# Patient Record
Sex: Male | Born: 2000 | Race: White | Hispanic: No | Marital: Single | Smoking: Never smoker
Health system: Southern US, Community
[De-identification: ages and names within clinical notes are randomized; demographics above are authoritative.]

## PROBLEM LIST (undated history)

## (undated) ENCOUNTER — Emergency Department (HOSPITAL_COMMUNITY): Admission: EM | Payer: Managed Care, Other (non HMO) | Source: Home / Self Care

## (undated) DIAGNOSIS — J45909 Unspecified asthma, uncomplicated: Secondary | ICD-10-CM

## (undated) HISTORY — PX: ELBOW SURGERY: SHX618

---

## 2021-11-19 ENCOUNTER — Encounter (HOSPITAL_COMMUNITY): Payer: Self-pay | Admitting: Emergency Medicine

## 2021-11-19 ENCOUNTER — Emergency Department (HOSPITAL_COMMUNITY)
Admission: EM | Admit: 2021-11-19 | Discharge: 2021-11-19 | Disposition: A | Discharge status: Home / Self Care | Payer: BC Managed Care – PPO | Attending: Emergency Medicine | Admitting: Emergency Medicine

## 2021-11-19 ENCOUNTER — Emergency Department (HOSPITAL_COMMUNITY): Payer: BC Managed Care – PPO

## 2021-11-19 ENCOUNTER — Other Ambulatory Visit: Payer: Self-pay

## 2021-11-19 DIAGNOSIS — Z20822 Contact with and (suspected) exposure to covid-19: Secondary | ICD-10-CM | POA: Insufficient documentation

## 2021-11-19 DIAGNOSIS — R0602 Shortness of breath: Secondary | ICD-10-CM | POA: Diagnosis present

## 2021-11-19 DIAGNOSIS — J4541 Moderate persistent asthma with (acute) exacerbation: Secondary | ICD-10-CM | POA: Diagnosis not present

## 2021-11-19 HISTORY — DX: Unspecified asthma, uncomplicated: J45.909

## 2021-11-19 LAB — RESP PANEL BY RT-PCR (FLU A&B, COVID) ARPGX2
Influenza A by PCR: NEGATIVE
Influenza B by PCR: NEGATIVE
SARS Coronavirus 2 by RT PCR: NEGATIVE

## 2021-11-19 MED ORDER — IPRATROPIUM-ALBUTEROL 0.5-2.5 (3) MG/3ML IN SOLN
3.0000 mL | Freq: Once | RESPIRATORY_TRACT | Status: AC
  Administered 2021-11-19: 3 mL via RESPIRATORY_TRACT
  Filled 2021-11-19: qty 3

## 2021-11-19 MED ORDER — METHYLPREDNISOLONE SODIUM SUCC 125 MG IJ SOLR
125.0000 mg | Freq: Once | INTRAMUSCULAR | Status: AC
  Administered 2021-11-19: 125 mg via INTRAVENOUS
  Filled 2021-11-19: qty 2

## 2021-11-19 MED ORDER — PREDNISONE 10 MG PO TABS: 40.0000 mg | ORAL_TABLET | Freq: Every day | ORAL | 0 refills | Status: DC

## 2021-11-19 MED ORDER — MAGNESIUM SULFATE 2 GM/50ML IV SOLN
2.0000 g | Freq: Once | INTRAVENOUS | Status: AC
  Administered 2021-11-19: 2 g via INTRAVENOUS
  Filled 2021-11-19: qty 50

## 2021-11-19 NOTE — ED Triage Notes (Signed)
Pt reports asthma problems, woke up SOB.  Pt reports smoking marijuana.

## 2021-11-19 NOTE — ED Provider Notes (Signed)
[facility] EMERGENCY DEPARTMENT Provider Note   CSN: [id number] Arrival date & time: 11/19/21  0539     History  Chief Complaint  Patient presents with   Shortness of Breath    Jeffery Anderson is a 21 y.o. male.   Shortness of Breath Associated symptoms: cough   Associated symptoms: no abdominal pain, no chest pain, no fever, no headaches, no rash and no vomiting    Patient is a 21 year old male with past medical history significant for asthma.  He states that he has an albuterol inhaler he states that he has previously been on Symbicort but is not currently.  He has not been following with pulmonologist or PCP due to insurance issues  He states that last night he became more short of breath he states he has had some cough and fatigue and shortness of breath over the past couple weeks which he attributed to weather changes states that his shortness of breath became significantly worse overnight.  He states that he uses albuterol inhaler without significant relief.     Home Medications Prior to Admission medications   Medication Sig Start Date End Date Taking? Authorizing Provider  predniSONE (DELTASONE) 10 MG tablet Take 4 tablets (40 mg total) by mouth daily. 11/19/21  Yes Tedd Sias, PA      Allergies    Patient has no known allergies.    Review of Systems   Review of Systems  Constitutional:  Positive for fatigue. Negative for chills and fever.  HENT:  Negative for congestion.   Eyes:  Negative for pain.  Respiratory:  Positive for cough and shortness of breath.   Cardiovascular:  Negative for chest pain and leg swelling.  Gastrointestinal:  Negative for abdominal pain and vomiting.  Genitourinary:  Negative for dysuria.  Musculoskeletal:  Negative for myalgias.  Skin:  Negative for rash.  Neurological:  Negative for dizziness and headaches.   Physical Exam Updated Vital Signs BP 127/76 (BP Location: Right Arm)    Pulse 84    Temp (!)  97.5 F (36.4 C) (Oral)    Resp 16    SpO2 97%  Physical Exam Vitals and nursing note reviewed.  Constitutional:      General: He is not in acute distress. HENT:     Head: Normocephalic and atraumatic.     Nose: Nose normal.  Eyes:     General: No scleral icterus. Cardiovascular:     Rate and Rhythm: Normal rate and regular rhythm.     Pulses: Normal pulses.     Heart sounds: Normal heart sounds.  Pulmonary:     Effort: Pulmonary effort is normal. No respiratory distress.     Breath sounds: No wheezing.     Comments: Speaking in full sentences, inspiratory and expiratory phase.  No wheezing auscultated. Abdominal:     Palpations: Abdomen is soft.     Tenderness: There is no abdominal tenderness.  Musculoskeletal:     Cervical back: Normal range of motion.     Right lower leg: No edema.     Left lower leg: No edema.  Skin:    General: Skin is warm and dry.     Capillary Refill: Capillary refill takes less than 2 seconds.  Neurological:     Mental Status: He is alert. Mental status is at baseline.  Psychiatric:        Mood and Affect: Mood normal.        Behavior: Behavior normal.  ED Results / Procedures / Treatments   Labs (all labs ordered are listed, but only abnormal results are displayed) Labs Reviewed  RESP PANEL BY RT-PCR (FLU A&B, COVID) ARPGX2    EKG None  Radiology DG Chest 2 View  Result Date: 11/19/2021 CLINICAL DATA:  Shortness of breath EXAM: CHEST - 2 VIEW COMPARISON:  None. FINDINGS: The heart size and mediastinal contours are within normal limits. Both lungs are clear. No pleural effusion or pneumothorax. The visualized skeletal structures are unremarkable. IMPRESSION: No acute process in the chest. Electronically Signed   By: Macy Mis M.D.   On: 11/19/2021 08:07    Procedures Procedures    Medications Ordered in ED Medications  ipratropium-albuterol (DUONEB) 0.5-2.5 (3) MG/3ML nebulizer solution 3 mL (3 mLs Nebulization Given 11/19/21  0604)  magnesium sulfate IVPB 2 g 50 mL (0 g Intravenous Stopped 11/19/21 0738)  methylPREDNISolone sodium succinate (SOLU-MEDROL) 125 mg/2 mL injection 125 mg (125 mg Intravenous Given 11/19/21 0606)  ipratropium-albuterol (DUONEB) 0.5-2.5 (3) MG/3ML nebulizer solution 3 mL (3 mLs Nebulization Given 11/19/21 [id number])    ED Course/ Medical Decision Making/ A&P                           Medical Decision Making Risk Prescription drug management.   This patient presents to the ED for concern of SOB, this involves a number of treatment options, and is a complaint that carries with it a high risk of complications and morbidity.  The differential diagnosis includes The causes for shortness of breath include but are not limited to Cardiac (AHF, pericardial effusion and tamponade, arrhythmias, ischemia, etc) Respiratory (COPD, asthma, pneumonia, pneumothorax, primary pulmonary hypertension, PE/VQ mismatch) Hematological (anemia) Neuromuscular (ALS, Guillain-Barr, etc)    Co morbidities: Discussed in HPI   Brief History: Patient is a 21 year old male with past medical history significant for asthma.  He states that he has an albuterol inhaler he states that he has previously been on Symbicort but is not currently.  He has not been following with pulmonologist or PCP due to insurance issues  He states that last night he became more short of breath he states he has had some cough and fatigue and shortness of breath over the past couple weeks which he attributed to weather changes states that his shortness of breath became significantly worse overnight.  He states that he uses albuterol inhaler without significant relief.  EMR reviewed including pt PMHx, past surgical history and past visits to ER.   See HPI for more details   Lab Tests:  I ordered and independently interpreted labs.  The pertinent results include:    Negative COVID influenza PCR   Imaging Studies:  NAD. I personally  reviewed all imaging studies and no acute abnormality found. I agree with radiology interpretation.    Cardiac Monitoring:  NA NA   Medicines ordered:  Patient received Solu-Medrol and DuoNeb and mag significant improvement after this during my evaluation Reevaluation of the patient after these medicines showed that the patient improved I have reviewed the patients home medicines and have made adjustments as needed   Critical Interventions:  Medications for asthma exacerbation   Consults:      Reevaluation:  After the interventions noted above I re-evaluated patient and found that they have :improved   Social Determinants of Health:  The patient's social determinants of health were a factor in the care of this patient Patient given information for the [facility] health  wellness clinic   Problem List / ED Course:  Asthma exacerbation.   Dispostion:  After consideration of the diagnostic results and the patients response to treatment, I feel that the patent would benefit from strict return precautions and plan to follow-up with the [facility] health wellness clinic.  Prednisone prescribed.  Patient states he has albuterol inhaler at home.   Final Clinical Impression(s) / ED Diagnoses Final diagnoses:  Moderate persistent asthma with acute exacerbation    Rx / DC Orders ED Discharge Orders          Ordered    predniSONE (DELTASONE) 10 MG tablet  Daily        11/19/21 0812              Tedd Sias, PA 11/20/21 2116    Elnora Morrison, MD 11/21/21 1546

## 2021-11-19 NOTE — Discharge Instructions (Addendum)
Please take prednisone as prescribed.  Use 4 puffs of albuterol every 4 hours for next 24 hours.  After that use as needed.  Please follow-up with the [facility] wellness clinic I given you the information for the number.

## 2021-11-19 NOTE — ED Provider Triage Note (Signed)
Emergency Medicine Provider Triage Evaluation Note  Jeffery Anderson , a 21 y.o. male  was evaluated in triage.  Pt complains of shortness of breath.  Patient states he has a history of asthma and typically experiences exacerbations when the weather changes.  He states he woke up in the middle of the night with shortness of breath and wheezing.  States that he uses inhalers without significant relief.  Physical Exam  BP 127/76 (BP Location: Right Arm)    Pulse 84    Temp (!) 97.5 F (36.4 C) (Oral)    Resp (!) 22    SpO2 97%  Gen:   Awake, no distress   Resp:  Normal effort  MSK:   Moves extremities without difficulty  Other:  Inspiratory and expiratory wheezes noted in all lung fields.  Patient speaking in clear and complete sentences.  Oxygen saturations at 97% on room air.  Medical Decision Making  Medically screening exam initiated at 5:54 AM.  Appropriate orders placed.  Kevin Fenton Scicchitano was informed that the remainder of the evaluation will be completed by another provider, this initial triage assessment does not replace that evaluation, and the importance of remaining in the ED until their evaluation is complete.   Rayna Sexton, PA-C 11/19/21 [phone]

## 2021-11-30 ENCOUNTER — Encounter (HOSPITAL_COMMUNITY): Payer: Self-pay | Admitting: *Deleted

## 2021-11-30 ENCOUNTER — Emergency Department (HOSPITAL_COMMUNITY)
Admission: EM | Admit: 2021-11-30 | Discharge: 2021-12-01 | Disposition: A | Discharge status: Home / Self Care | Payer: BC Managed Care – PPO | Attending: Emergency Medicine | Admitting: Emergency Medicine

## 2021-11-30 ENCOUNTER — Emergency Department (HOSPITAL_COMMUNITY): Payer: BC Managed Care – PPO

## 2021-11-30 ENCOUNTER — Other Ambulatory Visit: Payer: Self-pay

## 2021-11-30 DIAGNOSIS — J45901 Unspecified asthma with (acute) exacerbation: Secondary | ICD-10-CM | POA: Insufficient documentation

## 2021-11-30 DIAGNOSIS — Z20822 Contact with and (suspected) exposure to covid-19: Secondary | ICD-10-CM | POA: Insufficient documentation

## 2021-11-30 DIAGNOSIS — Z7722 Contact with and (suspected) exposure to environmental tobacco smoke (acute) (chronic): Secondary | ICD-10-CM | POA: Diagnosis not present

## 2021-11-30 DIAGNOSIS — Z7289 Other problems related to lifestyle: Secondary | ICD-10-CM | POA: Insufficient documentation

## 2021-11-30 DIAGNOSIS — R0602 Shortness of breath: Secondary | ICD-10-CM | POA: Diagnosis present

## 2021-11-30 DIAGNOSIS — Z7951 Long term (current) use of inhaled steroids: Secondary | ICD-10-CM | POA: Diagnosis not present

## 2021-11-30 LAB — CBC WITH DIFFERENTIAL/PLATELET
Abs Immature Granulocytes: 0.05 10*3/uL (ref 0.00–0.07)
Basophils Absolute: 0.1 10*3/uL (ref 0.0–0.1)
Basophils Relative: 1 %
Eosinophils Absolute: 1.2 10*3/uL — ABNORMAL HIGH (ref 0.0–0.5)
Eosinophils Relative: 9 %
HCT: 40.7 % (ref 39.0–52.0)
Hemoglobin: 13.7 g/dL (ref 13.0–17.0)
Immature Granulocytes: 0 %
Lymphocytes Relative: 19 %
Lymphs Abs: 2.5 10*3/uL (ref 0.7–4.0)
MCH: 30.4 pg (ref 26.0–34.0)
MCHC: 33.7 g/dL (ref 30.0–36.0)
MCV: 90.2 fL (ref 80.0–100.0)
Monocytes Absolute: 0.8 10*3/uL (ref 0.1–1.0)
Monocytes Relative: 6 %
Neutro Abs: 8.3 10*3/uL — ABNORMAL HIGH (ref 1.7–7.7)
Neutrophils Relative %: 65 %
Platelets: 257 10*3/uL (ref 150–400)
RBC: 4.51 MIL/uL (ref 4.22–5.81)
RDW: 12.4 % (ref 11.5–15.5)
WBC: 12.9 10*3/uL — ABNORMAL HIGH (ref 4.0–10.5)
nRBC: 0 % (ref 0.0–0.2)

## 2021-11-30 LAB — BASIC METABOLIC PANEL
Anion gap: 10 (ref 5–15)
BUN: 12 mg/dL (ref 6–20)
CO2: 23 mmol/L (ref 22–32)
Calcium: 9.4 mg/dL (ref 8.9–10.3)
Chloride: 108 mmol/L (ref 98–111)
Creatinine, Ser: 0.88 mg/dL (ref 0.61–1.24)
GFR, Estimated: >60 mL/min (ref >60)
Glucose, Bld: 93 mg/dL (ref 70–99)
Potassium: 3.5 mmol/L (ref 3.5–5.1)
Sodium: 141 mmol/L (ref 135–145)

## 2021-11-30 MED ORDER — METHYLPREDNISOLONE SODIUM SUCC 125 MG IJ SOLR
125.0000 mg | Freq: Once | INTRAMUSCULAR | Status: AC
Start: 2021-11-30 — End: 2021-11-30
  Administered 2021-11-30: 125 mg via INTRAVENOUS
  Filled 2021-11-30: qty 2

## 2021-11-30 MED ORDER — IPRATROPIUM-ALBUTEROL 0.5-2.5 (3) MG/3ML IN SOLN
3.0000 mL | Freq: Once | RESPIRATORY_TRACT | Status: AC
  Administered 2021-11-30: 3 mL via RESPIRATORY_TRACT
  Filled 2021-11-30: qty 3

## 2021-11-30 MED ORDER — ALBUTEROL (5 MG/ML) CONTINUOUS INHALATION SOLN
10.0000 mg/h | INHALATION_SOLUTION | Freq: Once | RESPIRATORY_TRACT | Status: AC
Start: 2021-11-30 — End: 2021-11-30
  Administered 2021-11-30: 10 mg/h via RESPIRATORY_TRACT
  Filled 2021-11-30: qty 0.5
  Filled 2021-11-30: qty 2

## 2021-11-30 MED ORDER — PREDNISONE 50 MG PO TABS
50.0000 mg | ORAL_TABLET | Freq: Every day | ORAL | 0 refills | Status: AC
Start: 2021-11-30 — End: 2021-12-07

## 2021-11-30 MED ORDER — ALBUTEROL SULFATE HFA 108 (90 BASE) MCG/ACT IN AERS: 1.0000 | INHALATION_SPRAY | Freq: Four times a day (QID) | RESPIRATORY_TRACT | 2 refills | Status: DC | PRN

## 2021-11-30 NOTE — ED Provider Notes (Signed)
[facility] EMERGENCY DEPARTMENT Provider Note   CSN: [id number] Arrival date & time: 11/30/21  2112     History  Chief Complaint  Patient presents with   Asthma    Jeffery Anderson is a 21 y.o. male.  HPI    21 year old male with history of asthma comes in with chief complaint of shortness of breath. He indicates that typically his asthma is triggered by weather change.  He was seen in the ER recently for asthma exacerbation, was started on prednisone and he did improve, but the symptoms returned.  Indicates that he is having difficulty in breathing and some chest tightness.  Denies any fevers, chills.  Has mild cough that is mostly nonproductive.  Admits to vaping and passive smoke exposure  Home Medications Prior to Admission medications   Medication Sig Start Date End Date Taking? Authorizing Provider  albuterol (VENTOLIN HFA) 108 (90 Base) MCG/ACT inhaler Inhale 1-2 puffs into the lungs every 6 (six) hours as needed for wheezing or shortness of breath. 11/30/21  Yes Jeanell Sparrow, DO  diphenhydrAMINE (BENADRYL) 25 MG tablet Take 25 mg by mouth every 6 (six) hours as needed for allergies or itching.   Yes [provider]  predniSONE (DELTASONE) 50 MG tablet Take 1 tablet (50 mg total) by mouth daily for 7 days. 11/30/21 12/07/21 Yes Jeanell Sparrow, DO  predniSONE (DELTASONE) 10 MG tablet Take 4 tablets (40 mg total) by mouth daily. Patient not taking: Reported on 11/30/2021 11/19/21   Tedd Sias, PA      Allergies    Patient has no known allergies.    Review of Systems   Review of Systems  All other systems reviewed and are negative.  Physical Exam Updated Vital Signs BP 105/70    Pulse 95    Temp 98.2 F (36.8 C)    Resp (!) 26    Ht 5\' 8"  (1.727 m)    Wt 59 kg    SpO2 95%    BMI 19.77 kg/m  Physical Exam Vitals and nursing note reviewed.  Constitutional:      Appearance: He is well-developed.  HENT:     Head: Atraumatic.   Cardiovascular:     Rate and Rhythm: Normal rate.  Pulmonary:     Effort: Respiratory distress present.     Comments: Tachypnea Musculoskeletal:     Cervical back: Neck supple.  Skin:    General: Skin is warm.  Neurological:     Mental Status: He is alert and oriented to person, place, and time.    ED Results / Procedures / Treatments   Labs (all labs ordered are listed, but only abnormal results are displayed) Labs Reviewed  CBC WITH DIFFERENTIAL/PLATELET - Abnormal; Notable for the following components:      Result Value   WBC 12.9 (*)    Neutro Abs 8.3 (*)    Eosinophils Absolute 1.2 (*)    All other components within normal limits  RESP PANEL BY RT-PCR (FLU A&B, COVID) ARPGX2  BASIC METABOLIC PANEL    EKG None  Radiology DG Chest Port 1 View  Result Date: 11/30/2021 CLINICAL DATA:  Asthma. EXAM: PORTABLE CHEST 1 VIEW COMPARISON:  Chest radiograph dated 11/19/2021. FINDINGS: The heart size and mediastinal contours are within normal limits. Both lungs are clear. The visualized skeletal structures are unremarkable. IMPRESSION: No active disease. Electronically Signed   By: Anner Crete M.D.   On: 11/30/2021 22:24    Procedures Procedures  Medications Ordered in ED Medications  ipratropium-albuterol (DUONEB) 0.5-2.5 (3) MG/3ML nebulizer solution 3 mL (3 mLs Nebulization Given 11/30/21 2247)  albuterol (PROVENTIL,VENTOLIN) solution continuous neb (10 mg/hr Nebulization Given 11/30/21 2239)  ipratropium-albuterol (DUONEB) 0.5-2.5 (3) MG/3ML nebulizer solution 3 mL (3 mLs Nebulization Given 11/30/21 2218)  methylPREDNISolone sodium succinate (SOLU-MEDROL) 125 mg/2 mL injection 125 mg (125 mg Intravenous Given 11/30/21 2258)    ED Course/ Medical Decision Making/ A&P Clinical Course as of 12/01/21 0002  Sat Nov 30, 2021  2359 Repeat exam reveals clearing of wheezing in all lung fields -still has fine wheezes. Patient is not in any respiratory distress nor is there  hypoxia.  [AN]    Clinical Course User Index [AN] Varney Biles, MD                           Medical Decision Making Problems Addressed: Moderate asthma with exacerbation, unspecified whether persistent: acute illness or injury with systemic symptoms  Amount and/or Complexity of Data Reviewed External Data Reviewed: radiology and notes. Labs: ordered. Radiology: ordered. Decision-making details documented in ED Course.  Risk Prescription drug management. Decision regarding hospitalization.   21 year old male comes in a chief complaint of shortness of breath and wheezing.  Noted to have mild respiratory distress and tachypnea with diffuse wheezing.  Patient started on continuous albuterol and ipratropium.  IV Solu-Medrol given.  Basic labs ordered.  CBC and metabolic profile is reassuring.  No loose cytosis, no renal failure.  COVID-19 and flu swab are also negative.  X-ray did not reveal any evidence of pneumothorax or focal opacity  Appears that patient simply has asthma exacerbation.  He was reassessed, symptoms have improved but not completely resolved.  He still in the midst of his breathing treatment.  His care will be signed out to the incoming team.  Considered admission, but patient is making rapid improvement -therefore anticipate discharge.   Final Clinical Impression(s) / ED Diagnoses Final diagnoses:  Moderate asthma with exacerbation, unspecified whether persistent  Engages in vaping    Rx / DC Orders ED Discharge Orders          Ordered    albuterol (VENTOLIN HFA) 108 (90 Base) MCG/ACT inhaler  Every 6 hours PRN        11/30/21 2357    predniSONE (DELTASONE) 50 MG tablet  Daily        11/30/21 2357              Varney Biles, MD 12/01/21 0008

## 2021-11-30 NOTE — Discharge Instructions (Addendum)
It was a pleasure caring for you today in the emergency department. ° °Please return to the emergency department for any worsening or worrisome symptoms. ° ° °

## 2021-11-30 NOTE — ED Provider Notes (Signed)
°  Provider Note MRN:  [id number]  Arrival date & time: 12/01/21    ED Course and Medical Decision Making  Assumed care from Dr Kathrynn Humble  at shift change.  See not from prior team for complete details, in brief: 21 yo male with of asthma, concern for exacerbation in the ED. Given appropriate treatment for asthma exacerbation. Protecting airway, respiratory status improved.   Plan per prior physician medications, re-assess, dispo- likely dc  Pt feeling much better after nebs, he is ambulatory w/ steady gait. No fevers or chills, no n/v.   The patient improved significantly and was discharged in stable condition. Detailed discussions were had with the patient regarding current findings, and need for close f/u with PCP or on call doctor. The patient has been instructed to return immediately if the symptoms worsen in any way for re-evaluation. Patient verbalized understanding and is in agreement with current care plan. All questions answered prior to discharge.    Counseled patient for approximately 3 minutes regarding smoking cessation. Discussed risks of smoking and how they applied and affected their visit here today. Patient not ready to quit at this time, however will follow up with their primary doctor when they are.   CPT code: [phone]: intermediate counseling for smoking cessation    Procedures  Final Clinical Impressions(s) / ED Diagnoses     ICD-10-CM   1. Moderate asthma with exacerbation, unspecified whether persistent  J45.901     2. Engages in vaping  Z72.89       ED Discharge Orders          Ordered    albuterol (VENTOLIN HFA) 108 (90 Base) MCG/ACT inhaler  Every 6 hours PRN        11/30/21 2357    predniSONE (DELTASONE) 50 MG tablet  Daily        11/30/21 2357              Discharge Instructions      It was a pleasure caring for you today in the emergency department.  Please return to the emergency department for any worsening or worrisome  symptoms.          Jeanell Sparrow, DO 12/01/21 0115

## 2021-11-30 NOTE — ED Triage Notes (Signed)
The pt reports that he has had difficulty breathing all day  he is an asthmatic and his rescue inhjaler is not helping him  audible wheezes

## 2021-12-01 LAB — RESP PANEL BY RT-PCR (FLU A&B, COVID) ARPGX2
Influenza A by PCR: NEGATIVE
Influenza B by PCR: NEGATIVE
SARS Coronavirus 2 by RT PCR: NEGATIVE

## 2022-01-19 ENCOUNTER — Other Ambulatory Visit: Payer: Self-pay

## 2022-01-29 ENCOUNTER — Encounter (HOSPITAL_COMMUNITY): Payer: Self-pay | Admitting: Emergency Medicine

## 2022-01-29 ENCOUNTER — Other Ambulatory Visit: Payer: Self-pay

## 2022-01-29 ENCOUNTER — Emergency Department (HOSPITAL_COMMUNITY): Payer: BC Managed Care – PPO

## 2022-01-29 ENCOUNTER — Emergency Department (HOSPITAL_COMMUNITY)
Admission: EM | Admit: 2022-01-29 | Discharge: 2022-01-29 | Disposition: A | Discharge status: Home / Self Care | Payer: BC Managed Care – PPO | Attending: Student | Admitting: Student

## 2022-01-29 DIAGNOSIS — R0602 Shortness of breath: Secondary | ICD-10-CM | POA: Diagnosis present

## 2022-01-29 DIAGNOSIS — J4531 Mild persistent asthma with (acute) exacerbation: Secondary | ICD-10-CM | POA: Diagnosis not present

## 2022-01-29 DIAGNOSIS — J45901 Unspecified asthma with (acute) exacerbation: Secondary | ICD-10-CM

## 2022-01-29 DIAGNOSIS — Z7952 Long term (current) use of systemic steroids: Secondary | ICD-10-CM | POA: Insufficient documentation

## 2022-01-29 LAB — CBC WITH DIFFERENTIAL/PLATELET
Abs Immature Granulocytes: 0 10*3/uL (ref 0.00–0.07)
Basophils Absolute: 0.4 10*3/uL — ABNORMAL HIGH (ref 0.0–0.1)
Basophils Relative: 4 %
Eosinophils Absolute: 1.8 10*3/uL — ABNORMAL HIGH (ref 0.0–0.5)
Eosinophils Relative: 18 %
HCT: 45 % (ref 39.0–52.0)
Hemoglobin: 15.7 g/dL (ref 13.0–17.0)
Lymphocytes Relative: 35 %
Lymphs Abs: 3.6 10*3/uL (ref 0.7–4.0)
MCH: 30.7 pg (ref 26.0–34.0)
MCHC: 34.9 g/dL (ref 30.0–36.0)
MCV: 87.9 fL (ref 80.0–100.0)
Monocytes Absolute: 0.8 10*3/uL (ref 0.1–1.0)
Monocytes Relative: 8 %
Neutro Abs: 3.6 10*3/uL (ref 1.7–7.7)
Neutrophils Relative %: 35 %
Platelets: 302 10*3/uL (ref 150–400)
RBC: 5.12 MIL/uL (ref 4.22–5.81)
RDW: 12.1 % (ref 11.5–15.5)
WBC: 10.2 10*3/uL (ref 4.0–10.5)
nRBC: 0 % (ref 0.0–0.2)
nRBC: 0 /100 WBC

## 2022-01-29 LAB — BASIC METABOLIC PANEL
Anion gap: 9 (ref 5–15)
BUN: 11 mg/dL (ref 6–20)
CO2: 24 mmol/L (ref 22–32)
Calcium: 9.8 mg/dL (ref 8.9–10.3)
Chloride: 106 mmol/L (ref 98–111)
Creatinine, Ser: 0.89 mg/dL (ref 0.61–1.24)
GFR, Estimated: >60 mL/min (ref >60)
Glucose, Bld: 100 mg/dL — ABNORMAL HIGH (ref 70–99)
Potassium: 3.8 mmol/L (ref 3.5–5.1)
Sodium: 139 mmol/L (ref 135–145)

## 2022-01-29 MED ORDER — MONTELUKAST SODIUM 10 MG PO TABS: 10.0000 mg | ORAL_TABLET | Freq: Every day | ORAL | 0 refills | Status: DC

## 2022-01-29 MED ORDER — IPRATROPIUM-ALBUTEROL 0.5-2.5 (3) MG/3ML IN SOLN
3.0000 mL | Freq: Once | RESPIRATORY_TRACT | Status: DC
Start: 2022-01-29 — End: 2022-01-29

## 2022-01-29 MED ORDER — IPRATROPIUM-ALBUTEROL 0.5-2.5 (3) MG/3ML IN SOLN: 3.0000 mL | Freq: Once | RESPIRATORY_TRACT | Status: DC

## 2022-01-29 MED ORDER — ALBUTEROL SULFATE (2.5 MG/3ML) 0.083% IN NEBU
10.0000 mg/h | INHALATION_SOLUTION | Freq: Once | RESPIRATORY_TRACT | Status: AC
  Administered 2022-01-29: 10 mg/h via RESPIRATORY_TRACT
  Filled 2022-01-29: qty 12

## 2022-01-29 MED ORDER — ONDANSETRON HCL 4 MG/2ML IJ SOLN
4.0000 mg | Freq: Once | INTRAMUSCULAR | Status: AC | PRN
  Administered 2022-01-29: 4 mg via INTRAVENOUS
  Filled 2022-01-29: qty 2

## 2022-01-29 MED ORDER — PREDNISONE 20 MG PO TABS: 40.0000 mg | ORAL_TABLET | Freq: Every day | ORAL | 0 refills | Status: DC

## 2022-01-29 MED ORDER — METHYLPREDNISOLONE SODIUM SUCC 125 MG IJ SOLR
125.0000 mg | Freq: Once | INTRAMUSCULAR | Status: AC
  Administered 2022-01-29: 125 mg via INTRAVENOUS
  Filled 2022-01-29: qty 2

## 2022-01-29 MED ORDER — IPRATROPIUM-ALBUTEROL 0.5-2.5 (3) MG/3ML IN SOLN
3.0000 mL | Freq: Once | RESPIRATORY_TRACT | Status: AC
  Administered 2022-01-29: 3 mL via RESPIRATORY_TRACT
  Filled 2022-01-29: qty 3

## 2022-01-29 NOTE — ED Provider Notes (Signed)
?[facility] ?Provider Note ? ? ?CSN: [id number] ?Arrival date & time: 01/29/22  [id number] ? ?  ? ?History ? ?Chief Complaint  ?Patient presents with  ? Shortness of Breath  ? ? ?Jeffery Anderson is a 21 y.o. male. ? ?Patient with history of asthma, frequent flares, currently on albuterol inhaler and montelukast --presents to the emergency department for asthma exacerbation.  Patient states that he awakes nearly nightly with shortness of breath.  This morning he awoke around 2:30 AM and he used his inhaler.  He then went back to sleep.  At around 4 AM he awoke again with recurrent symptoms and decided to come to the emergency department.  No recent fevers or cough.  Asthma flare is consistent with previous history.  No chest pain, vomiting, abdominal pain.  He has been on inhaled corticosteroid in the past, but not currently.  States that he just got insurance and does not currently have a primary care doctor. ? ? ?  ? ?Home Medications ?Prior to Admission medications   ?Medication Sig Start Date End Date Taking? Authorizing Provider  ?montelukast (SINGULAIR) 10 MG tablet Take 1 tablet (10 mg total) by mouth at bedtime. 01/29/22  Yes Renne CriglerGeiple, Forestine Macho, PA-C  ?predniSONE (DELTASONE) 20 MG tablet Take 2 tablets (40 mg total) by mouth daily. 01/29/22  Yes Renne CriglerGeiple, Denielle Bayard, PA-C  ?albuterol (VENTOLIN HFA) 108 (90 Base) MCG/ACT inhaler Inhale 1-2 puffs into the lungs every 6 (six) hours as needed for wheezing or shortness of breath. 11/30/21   Sloan LeiterGray, Samuel A, DO  ?diphenhydrAMINE (BENADRYL) 25 MG tablet Take 25 mg by mouth every 6 (six) hours as needed for allergies or itching.    [provider]  ?   ? ?Allergies    ?Patient has no known allergies.   ? ?Review of Systems   ?Review of Systems ? ?Physical Exam ?Updated Vital Signs ?BP 126/88 (BP Location: Right Arm)   Pulse (!) 104   Temp 97.9 ?F (36.6 ?C)   Resp 20   SpO2 95%  ?Physical Exam ?Vitals and nursing note reviewed.   ?Constitutional:   ?   General: He is not in acute distress. ?   Appearance: He is well-developed.  ?HENT:  ?   Head: Normocephalic and atraumatic.  ?Eyes:  ?   General:     ?   Right eye: No discharge.     ?   Left eye: No discharge.  ?   Conjunctiva/sclera: Conjunctivae normal.  ?Cardiovascular:  ?   Rate and Rhythm: Normal rate and regular rhythm.  ?   Heart sounds: Normal heart sounds.  ?Pulmonary:  ?   Effort: Pulmonary effort is normal.  ?   Breath sounds: Wheezing present.  ?   Comments: Minimal expiratory wheezing, patient seen after he has received breathing treatment in the emergency department. ?Abdominal:  ?   Palpations: Abdomen is soft.  ?   Tenderness: There is no abdominal tenderness.  ?Musculoskeletal:  ?   Cervical back: Normal range of motion and neck supple.  ?Skin: ?   General: Skin is warm and dry.  ?Neurological:  ?   Mental Status: He is alert.  ? ? ?ED Results / Procedures / Treatments   ?Labs ?(all labs ordered are listed, but only abnormal results are displayed) ?Labs Reviewed  ?BASIC METABOLIC PANEL - Abnormal; Notable for the following components:  ?    Result Value  ? Glucose, Bld 100 (*)   ? All  other components within normal limits  ?CBC WITH DIFFERENTIAL/PLATELET - Abnormal; Notable for the following components:  ? Eosinophils Absolute 1.8 (*)   ? Basophils Absolute 0.4 (*)   ? All other components within normal limits  ? ? ?EKG ?None ? ?Radiology ?DG Chest Port 1 View ? ?Result Date: 01/29/2022 ?CLINICAL DATA:  sob, wheezing EXAM: PORTABLE CHEST 1 VIEW COMPARISON:  Chest x-ray November 30, 2021. FINDINGS: Subtle left basilar opacity. Otherwise, clear lungs. No visible pleural effusions or pneumothorax. Cardiomediastinal silhouette is within normal limits. IMPRESSION: Subtle left basilar opacity, which could represent early pneumonia in the correct clinical setting. Recommend follow-up imaging (preferably PA and lateral radiographs) to resolution. Electronically Signed   By:  Feliberto Harts M.D.   On: 01/29/2022 06:19   ? ?Procedures ?Procedures  ? ? ?Medications Ordered in ED ?Medications  ?ipratropium-albuterol (DUONEB) 0.5-2.5 (3) MG/3ML nebulizer solution 3 mL (has no administration in time range)  ?ipratropium-albuterol (DUONEB) 0.5-2.5 (3) MG/3ML nebulizer solution 3 mL (has no administration in time range)  ?ipratropium-albuterol (DUONEB) 0.5-2.5 (3) MG/3ML nebulizer solution 3 mL (3 mLs Nebulization Given 01/29/22 0547)  ?albuterol (PROVENTIL) (2.5 MG/3ML) 0.083% nebulizer solution (10 mg/hr Nebulization Given 01/29/22 0620)  ?methylPREDNISolone sodium succinate (SOLU-MEDROL) 125 mg/2 mL injection 125 mg (125 mg Intravenous Given 01/29/22 0614)  ?ondansetron [facility]) injection 4 mg (4 mg Intravenous Given 01/29/22 0611)  ? ? ?ED Course/ Medical Decision Making/ A&P ?  ? ?Patient seen and examined. History obtained directly from patient. Work-up including labs, imaging, EKG ordered in triage, if performed, were reviewed.   ? ?Labs/EKG: Independently reviewed and interpreted.  This included: BMP unremarkable CBC unremarkable with normal white blood cell count. ? ?Imaging: Independently visualized and interpreted.  This included: Chest x-ray.  Left basilar opacity noted.  Feel that this is less likely pneumonia given patient's current symptoms with no fever, no elevated white count, symptoms consistent with asthma. ? ?Medications/Fluids: Ordered: Patient has received breathing treatment with albuterol, Atrovent, and continuous albuterol nebulizer.  Now completed. ? ?Most recent vital signs reviewed and are as follows: ?BP 126/88 (BP Location: Right Arm)   Pulse (!) 104   Temp 97.9 ?F (36.6 ?C)   Resp 20   SpO2 95%  ?   ? ?Initial impression: asthma exacerbation ? ?Plan: Discharge to home.  ? ?Prescriptions written for: Prednisone, montelukast ? ?Other home care instructions discussed: Avoidance of triggers ? ?ED return instructions discussed: Worsening shortness of breath or  trouble breathing ? ?Follow-up instructions discussed: Patient encouraged to follow-up with their PCP in 3 days.  Referrals given. ? ?                        ?Medical Decision Making ?Risk ?Prescription drug management. ? ? ?Patient in otherwise normal state of health, with asthma exacerbation that was uncontrolled at home this morning.  He is doing much better now after treatment in the ED.  Work-up as above.  X-ray is questionable for left lower lobe opacity, however patient's clinical exam is not consistent with acute bacterial pneumonia.  Do not feel antibiotics warranted at this time.  Patient would benefit from PCP follow-up as his asthma is currently poorly controlled.  Low concern for PE, atypical infection, aspiration. ? ? ? ? ? ? ? ? ? ?Final Clinical Impression(s) / ED Diagnoses ?Final diagnoses:  ?Exacerbation of persistent asthma, unspecified asthma severity  ? ? ?Rx / DC Orders ?ED Discharge Orders   ? ?  Ordered  ?  predniSONE (DELTASONE) 20 MG tablet  Daily       ? 01/29/22 0758  ?  montelukast (SINGULAIR) 10 MG tablet  Daily at bedtime       ? 01/29/22 0758  ? ?  ?  ? ?  ? ? ?  ?Renne Crigler, PA-C ?01/29/22 0805 ? ?  ?Terrilee Files, MD ?01/29/22 1828 ? ?

## 2022-01-29 NOTE — ED Notes (Signed)
Neb treatment complete, patient reports feeling better. No audible wheezing noted. No resp distress noted, able to speak in full sentences. ?

## 2022-01-29 NOTE — ED Provider Triage Note (Signed)
Emergency Medicine Provider Triage Evaluation Note ? ?Jeffery Anderson , a 21 y.o. male  was evaluated in triage.  Pt complains of with history of poorly controlled moderate persistent asthma who presents in acute exacerbation.  Woke up at 230 with shortness of breath and chest tightness, not improved with home albuterol inhaler.  Was on steroids in January, February, and March of this year for asthma.  No inhaled corticosteroids.  Daily montelukast ?Review of Systems  ?Positive: Chest tightness, wheezing, and shortness of breath when ?Negative: Vomiting, diarrhea, fevers, chills ? ?Physical Exam  ?BP (!) 128/95 (BP Location: Right Arm)   Pulse 99   Temp 97.9 ?F (36.6 ?C)   Resp 19   SpO2 95%  ?Gen:   Awake, no distress   ?Resp:  Increased work of breathing with accessory muscle use, subcostal retractions, tachypnea, wheezing at the lung fields bilaterally, and decreased air movement throughout the lung fields bilaterally ?MSK:   Moves extremities without difficulty  ?Other:  HR to 110s, regular rhythm, no LE edema.  ? ?Medical Decision Making  ?Medically screening exam initiated at 5:42 AM.  Appropriate orders placed.  Rodena Medin Heide was informed that the remainder of the evaluation will be completed by another provider, this initial triage assessment does not replace that evaluation, and the importance of remaining in the ED until their evaluation is complete. ? ?Nebs ordered in triage, duoneb started by triage RN.  ?This chart was dictated using voice recognition software, Dragon. Despite the best efforts of this provider to proofread and correct errors, errors may still occur which can change documentation meaning. ? ?  ?Paris Lore, PA-C ?01/29/22 0546 ? ?

## 2022-01-29 NOTE — Discharge Instructions (Signed)
Please read and follow all provided instructions. ? ?Your diagnoses today include:  ?1. Exacerbation of persistent asthma, unspecified asthma severity   ? ? ?Tests performed today include: ?Complete blood cell count:  ?Basic metabolic panel:  ?Chest x-ray: question LLL opacity but your symptoms are not really concerning for pneumonia today ?Vital signs. See below for your results today.  ? ?Medications prescribed:  ?Prednisone - steroid medicine  ? ?It is best to take this medication in the morning to prevent sleeping problems. If you are diabetic, monitor your blood sugar closely and stop taking Prednisone if blood sugar is over 300. Take with food to prevent stomach upset.  ? ?Take any prescribed medications only as directed. ? ?Home care instructions:  ?Follow any educational materials contained in this packet. ? ?Follow-up instructions: ?Please follow-up with your primary care provider in the next 3 days for further evaluation of your symptoms and management of your asthma. ? ?Return instructions:  ?Please return to the Emergency Department if you experience worsening symptoms. ?Please return with worsening wheezing, shortness of breath, or difficulty breathing. ?Return with persistent fever above 101F.  ?Please return if you have any other emergent concerns. ? ?Additional Information: ? ?Your vital signs today were: ?BP 126/88 (BP Location: Right Arm)   Pulse (!) 104   Temp 97.9 ?F (36.6 ?C)   Resp 20   SpO2 95%  ?If your blood pressure (BP) was elevated above 135/85 this visit, please have this repeated by your doctor within one month. ?-------------- ? ?

## 2022-01-29 NOTE — ED Triage Notes (Signed)
Patient here with shortness of breath, woke up at 0230 and used his inhaler.  Patient woke up again with continued shortness of breath.  Patient states that the inhaler is not helping this morning.   ?

## 2022-02-13 ENCOUNTER — Emergency Department (HOSPITAL_COMMUNITY)
Admission: EM | Admit: 2022-02-13 | Discharge: 2022-02-13 | Disposition: A | Discharge status: Home / Self Care | Payer: BC Managed Care – PPO | Attending: Emergency Medicine | Admitting: Emergency Medicine

## 2022-02-13 ENCOUNTER — Encounter (HOSPITAL_COMMUNITY): Payer: Self-pay

## 2022-02-13 DIAGNOSIS — R059 Cough, unspecified: Secondary | ICD-10-CM | POA: Insufficient documentation

## 2022-02-13 DIAGNOSIS — J4541 Moderate persistent asthma with (acute) exacerbation: Secondary | ICD-10-CM

## 2022-02-13 DIAGNOSIS — R0602 Shortness of breath: Secondary | ICD-10-CM | POA: Diagnosis not present

## 2022-02-13 MED ORDER — ALBUTEROL SULFATE HFA 108 (90 BASE) MCG/ACT IN AERS: 2.0000 | INHALATION_SPRAY | RESPIRATORY_TRACT | 1 refills | Status: DC | PRN

## 2022-02-13 MED ORDER — IPRATROPIUM BROMIDE 0.02 % IN SOLN
0.5000 mg | Freq: Once | RESPIRATORY_TRACT | Status: AC
  Administered 2022-02-13: 0.5 mg via RESPIRATORY_TRACT
  Filled 2022-02-13: qty 2.5

## 2022-02-13 MED ORDER — PREDNISONE 20 MG PO TABS
60.0000 mg | ORAL_TABLET | Freq: Once | ORAL | Status: AC
  Administered 2022-02-13: 60 mg via ORAL
  Filled 2022-02-13: qty 3

## 2022-02-13 MED ORDER — ALBUTEROL SULFATE (2.5 MG/3ML) 0.083% IN NEBU
5.0000 mg | INHALATION_SOLUTION | Freq: Once | RESPIRATORY_TRACT | Status: AC
  Administered 2022-02-13: 5 mg via RESPIRATORY_TRACT
  Filled 2022-02-13: qty 6

## 2022-02-13 MED ORDER — PREDNISONE 20 MG PO TABS: 60.0000 mg | ORAL_TABLET | Freq: Every day | ORAL | 0 refills | Status: DC

## 2022-02-13 NOTE — ED Provider Notes (Signed)
?[facility]-EMERGENCY DEPT ?Provider Note ? ? ?CSN: [id number] ?Arrival date & time: 02/13/22  0502 ? ?  ? ?History ? ?Chief Complaint  ?Patient presents with  ? Asthma  ? ? ?Jeffery Anderson is a 21 y.o. male. ? ?Pt with hx asthma c/o increased wheezing/sob in past day. Occasional non prod cough. No sore throat or runny nose. No fever or chills. No chest pain or discomfort. Uses mdi prn. Non smoker. Denies leg pain or swelling. Not currently on po steroid therapy. Denies current environmental allergy problems.  ? ?The history is provided by the patient and medical records.  ?Asthma ?Associated symptoms include shortness of breath. Pertinent negatives include no chest pain, no abdominal pain and no headaches.  ? ?  ? ?Home Medications ?Prior to Admission medications   ?Medication Sig Start Date End Date Taking? Authorizing Provider  ?albuterol (VENTOLIN HFA) 108 (90 Base) MCG/ACT inhaler Inhale 1-2 puffs into the lungs every 6 (six) hours as needed for wheezing or shortness of breath. 11/30/21   Sloan Leiter, DO  ?diphenhydrAMINE (BENADRYL) 25 MG tablet Take 25 mg by mouth every 6 (six) hours as needed for allergies or itching.    [provider]  ?montelukast (SINGULAIR) 10 MG tablet Take 1 tablet (10 mg total) by mouth at bedtime. 01/29/22   Renne Crigler, PA-C  ?predniSONE (DELTASONE) 20 MG tablet Take 2 tablets (40 mg total) by mouth daily. 01/29/22   Renne Crigler, PA-C  ?   ? ?Allergies    ?Patient has no known allergies.   ? ?Review of Systems   ?Review of Systems  ?Constitutional:  Negative for fever.  ?HENT:  Negative for sore throat.   ?Eyes:  Negative for redness.  ?Respiratory:  Positive for shortness of breath and wheezing.   ?Cardiovascular:  Negative for chest pain and leg swelling.  ?Gastrointestinal:  Negative for abdominal pain, diarrhea and vomiting.  ?Genitourinary:  Negative for flank pain.  ?Musculoskeletal:  Negative for back pain and neck pain.  ?Skin:  Negative  for rash.  ?Neurological:  Negative for headaches.  ?Hematological:  Does not bruise/bleed easily.  ?Psychiatric/Behavioral:  Negative for confusion.   ? ?Physical Exam ?Updated Vital Signs ?BP 135/74   Pulse 84   Temp (!) 97.4 ?F (36.3 ?C) (Oral)   Resp 10   SpO2 98%  ?Physical Exam ?Vitals and nursing note reviewed.  ?Constitutional:   ?   Appearance: Normal appearance. He is well-developed.  ?HENT:  ?   Head: Atraumatic.  ?   Nose: Nose normal.  ?   Mouth/Throat:  ?   Mouth: Mucous membranes are moist.  ?   Pharynx: Oropharynx is clear.  ?Eyes:  ?   General: No scleral icterus. ?   Conjunctiva/sclera: Conjunctivae normal.  ?Neck:  ?   Trachea: No tracheal deviation.  ?Cardiovascular:  ?   Rate and Rhythm: Normal rate and regular rhythm.  ?   Pulses: Normal pulses.  ?   Heart sounds: Normal heart sounds. No murmur heard. ?  No friction rub. No gallop.  ?Pulmonary:  ?   Effort: Pulmonary effort is normal. No accessory muscle usage or respiratory distress.  ?   Breath sounds: Wheezing present.  ?Abdominal:  ?   General: Bowel sounds are normal. There is no distension.  ?   Palpations: Abdomen is soft.  ?   Tenderness: There is no abdominal tenderness.  ?Genitourinary: ?   Comments: No cva tenderness. ?Musculoskeletal:     ?  General: No swelling or tenderness.  ?   Cervical back: Normal range of motion and neck supple. No rigidity.  ?   Right lower leg: No edema.  ?   Left lower leg: No edema.  ?Skin: ?   General: Skin is warm and dry.  ?   Findings: No rash.  ?Neurological:  ?   Mental Status: He is alert.  ?   Comments: Alert, speech clear.   ?Psychiatric:     ?   Mood and Affect: Mood normal.  ? ? ?ED Results / Procedures / Treatments   ?Labs ?(all labs ordered are listed, but only abnormal results are displayed) ?Labs Reviewed - No data to display ? ?EKG ?EKG Interpretation ? ?Date/Time:  Thursday February 13 2022 05:19:15 EDT ?Ventricular Rate:  104 ?PR Interval:  127 ?QRS Duration: 77 ?QT Interval:  330 ?QTC  Calculation: 434 ?R Axis:   86 ?Text Interpretation: Sinus tachycardia Nonspecific T wave abnormality No significant change since last tracing Confirmed by Cathren Laine ([id number]) on 02/13/2022 6:23:49 AM ? ?Radiology ?No results found. ? ?Procedures ?Procedures  ? ? ?Medications Ordered in ED ?Medications  ?predniSONE (DELTASONE) tablet 60 mg (has no administration in time range)  ?albuterol (PROVENTIL) (2.5 MG/3ML) 0.083% nebulizer solution 5 mg (5 mg Nebulization Given 02/13/22 0622)  ?ipratropium (ATROVENT) nebulizer solution 0.5 mg (0.5 mg Nebulization Given 02/13/22 0622)  ? ? ?ED Course/ Medical Decision Making/ A&P ?  ?                        ?Medical Decision Making ?Problems Addressed: ?Moderate persistent asthma with exacerbation: acute illness or injury with systemic symptoms that poses a threat to life or bodily functions ? ?Amount and/or Complexity of Data Reviewed ?External Data Reviewed: labs, radiology and notes. ? ?Risk ?Prescription drug management. ? ? ?Iv ns. Continuous pulse ox and cardiac monitoring.  ? ?Reviewed nursing notes and prior charts for additional history. External reports reviewed.  ? ?Cardiac monitor: sinus rhythm, rate 86. ? ?Prednisone po. Albuterol and atrovent tx. ? ?Recheck wheezing improved. Pt breathing comfortably. O2 sats 99%.  ? ?Pt currently appears stable for d/c.  ? ?Return precautions provided. ? ? ? ? ? ? ? ? ? ?Final Clinical Impression(s) / ED Diagnoses ?Final diagnoses:  ?None  ? ? ?Rx / DC Orders ?ED Discharge Orders   ? ? None  ? ?  ? ? ?  ?Cathren Laine, MD ?02/13/22 [phone] ? ?

## 2022-02-13 NOTE — ED Triage Notes (Incomplete)
Patient arrived with complaints of an asthma attack with only temporary relief from rescue inhaler. Audible wheezing. ? 20 G left AC, 125 solumedrol given and two duonebs.  ?

## 2022-02-13 NOTE — Discharge Instructions (Addendum)
It was our pleasure to provide your ER care today - we hope that you feel better. ? ?Take prednisone as prescribed. Use albuterol inhaler as need.  ? ?Follow up with primary care doctor in the coming week. ? ?Return to ER if worse, new symptoms, increased trouble breathing, high fevers, chest pain, or other concern.  ?

## 2022-03-27 ENCOUNTER — Other Ambulatory Visit: Payer: Self-pay

## 2022-03-27 ENCOUNTER — Encounter (HOSPITAL_COMMUNITY): Payer: Self-pay

## 2022-03-27 ENCOUNTER — Emergency Department (HOSPITAL_COMMUNITY)
Admission: EM | Admit: 2022-03-27 | Discharge: 2022-03-27 | Disposition: A | Discharge status: Home / Self Care | Payer: BC Managed Care – PPO | Attending: Emergency Medicine | Admitting: Emergency Medicine

## 2022-03-27 DIAGNOSIS — J45901 Unspecified asthma with (acute) exacerbation: Secondary | ICD-10-CM | POA: Diagnosis not present

## 2022-03-27 DIAGNOSIS — R0602 Shortness of breath: Secondary | ICD-10-CM | POA: Diagnosis present

## 2022-03-27 DIAGNOSIS — Z7951 Long term (current) use of inhaled steroids: Secondary | ICD-10-CM | POA: Insufficient documentation

## 2022-03-27 MED ORDER — ALBUTEROL SULFATE HFA 108 (90 BASE) MCG/ACT IN AERS
2.0000 | INHALATION_SPRAY | RESPIRATORY_TRACT | Status: DC | PRN
Start: 2022-03-27 — End: 2022-03-27
  Administered 2022-03-27: 2 via RESPIRATORY_TRACT
  Filled 2022-03-27: qty 6.7

## 2022-03-27 MED ORDER — PREDNISONE 20 MG PO TABS: 40.0000 mg | ORAL_TABLET | Freq: Every day | ORAL | 0 refills | Status: DC

## 2022-03-27 MED ORDER — PREDNISONE 20 MG PO TABS
60.0000 mg | ORAL_TABLET | Freq: Once | ORAL | Status: AC
  Administered 2022-03-27: 60 mg via ORAL
  Filled 2022-03-27: qty 3

## 2022-03-27 MED ORDER — ALBUTEROL SULFATE (2.5 MG/3ML) 0.083% IN NEBU
5.0000 mg | INHALATION_SOLUTION | Freq: Once | RESPIRATORY_TRACT | Status: AC
  Administered 2022-03-27: 5 mg via RESPIRATORY_TRACT
  Filled 2022-03-27: qty 6

## 2022-03-27 MED ORDER — IPRATROPIUM BROMIDE 0.02 % IN SOLN
0.5000 mg | Freq: Once | RESPIRATORY_TRACT | Status: AC
  Administered 2022-03-27: 0.5 mg via RESPIRATORY_TRACT
  Filled 2022-03-27: qty 2.5

## 2022-03-27 NOTE — Discharge Instructions (Signed)
Please read and follow all provided instructions.  Your diagnoses today include:  1. Asthma with acute exacerbation, unspecified asthma severity, unspecified whether persistent     Tests performed today include: Vital signs. See below for your results today.   Medications prescribed:  Albuterol inhaler - medication that opens up your airway  Use inhaler as follows: 1-2 puffs with spacer every 4 hours as needed for wheezing, cough, or shortness of breath.   Prednisone - steroid medicine   It is best to take this medication in the morning to prevent sleeping problems. If you are diabetic, monitor your blood sugar closely and stop taking Prednisone if blood sugar is over 300. Take with food to prevent stomach upset.   Take any prescribed medications only as directed.  Home care instructions:  Follow any educational materials contained in this packet.  Follow-up instructions: Please follow-up with your primary care provider in the next 3 days for further evaluation of your symptoms and management of your asthma.  Return instructions:  Please return to the Emergency Department if you experience worsening symptoms. Please return with worsening wheezing, shortness of breath, or difficulty breathing. Return with persistent fever above 101F.  Please return if you have any other emergent concerns.  Additional Information:  Your vital signs today were: BP 133/79 (BP Location: Left Arm)   Pulse 93   Temp 97.7 F (36.5 C) (Oral)   Resp 18   Ht 5\' 8"  (1.727 m)   SpO2 97%   BMI 19.77 kg/m  If your blood pressure (BP) was elevated above 135/85 this visit, please have this repeated by your doctor within one month. --------------

## 2022-03-27 NOTE — ED Triage Notes (Signed)
Patient c/o asthma/SOB x 2 days. Patient states albuterol inhaler not working.
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
## 2022-04-05 ENCOUNTER — Emergency Department (HOSPITAL_COMMUNITY)
Admission: EM | Admit: 2022-04-05 | Discharge: 2022-04-05 | Disposition: A | Discharge status: Home / Self Care | Payer: BC Managed Care – PPO | Attending: Emergency Medicine | Admitting: Emergency Medicine

## 2022-04-05 ENCOUNTER — Encounter (HOSPITAL_COMMUNITY): Payer: Self-pay

## 2022-04-05 ENCOUNTER — Other Ambulatory Visit: Payer: Self-pay

## 2022-04-05 DIAGNOSIS — R062 Wheezing: Secondary | ICD-10-CM

## 2022-04-05 DIAGNOSIS — R0602 Shortness of breath: Secondary | ICD-10-CM

## 2022-04-05 DIAGNOSIS — J45901 Unspecified asthma with (acute) exacerbation: Secondary | ICD-10-CM | POA: Diagnosis not present

## 2022-04-05 MED ORDER — PREDNISONE 50 MG PO TABS: 50.0000 mg | ORAL_TABLET | Freq: Every day | ORAL | 0 refills | Status: AC
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
## 2022-04-05 NOTE — Discharge Instructions (Signed)
Your history and exam today are consistent with a recurrent exacerbation of your reactive airway disease/asthma.  We offered and discussed getting labs and imaging however given the similarity to prior and improving symptoms already, we agree with discharge home with another burst of steroids and outpatient pulmonology follow-up.  If any symptoms change or worsen acutely, please return to the nearest emergency department.  Please rest and stay hydrated.

## 2022-05-13 ENCOUNTER — Encounter (HOSPITAL_COMMUNITY): Payer: Self-pay

## 2022-05-13 ENCOUNTER — Emergency Department (HOSPITAL_COMMUNITY)
Admission: EM | Admit: 2022-05-13 | Discharge: 2022-05-13 | Disposition: A | Discharge status: Home / Self Care | Payer: BC Managed Care – PPO | Attending: Emergency Medicine | Admitting: Emergency Medicine

## 2022-05-13 ENCOUNTER — Emergency Department (HOSPITAL_COMMUNITY): Payer: BC Managed Care – PPO

## 2022-05-13 DIAGNOSIS — J4541 Moderate persistent asthma with (acute) exacerbation: Secondary | ICD-10-CM | POA: Insufficient documentation

## 2022-05-13 DIAGNOSIS — R0602 Shortness of breath: Secondary | ICD-10-CM | POA: Diagnosis present

## 2022-05-13 DIAGNOSIS — Z7952 Long term (current) use of systemic steroids: Secondary | ICD-10-CM | POA: Diagnosis not present

## 2022-05-13 MED ORDER — PREDNISONE 20 MG PO TABS
60.0000 mg | ORAL_TABLET | Freq: Once | ORAL | Status: AC
  Administered 2022-05-13: 60 mg via ORAL
  Filled 2022-05-13: qty 3

## 2022-05-13 MED ORDER — IPRATROPIUM BROMIDE 0.02 % IN SOLN
0.5000 mg | Freq: Once | RESPIRATORY_TRACT | Status: AC
  Administered 2022-05-13: 0.5 mg via RESPIRATORY_TRACT
  Filled 2022-05-13: qty 2.5

## 2022-05-13 MED ORDER — PREDNISONE 10 MG PO TABS: 50.0000 mg | ORAL_TABLET | Freq: Every day | ORAL | 0 refills | Status: DC

## 2022-05-13 MED ORDER — ONDANSETRON 4 MG PO TBDP
ORAL_TABLET | ORAL | Status: AC
  Filled 2022-05-13: qty 1

## 2022-05-13 MED ORDER — PREDNISONE 20 MG PO TABS
20.0000 mg | ORAL_TABLET | Freq: Once | ORAL | Status: AC
  Administered 2022-05-13: 20 mg via ORAL
  Filled 2022-05-13: qty 1

## 2022-05-13 MED ORDER — ALBUTEROL SULFATE (2.5 MG/3ML) 0.083% IN NEBU
10.0000 mg/h | INHALATION_SOLUTION | Freq: Once | RESPIRATORY_TRACT | Status: AC
  Administered 2022-05-13: 10 mg/h via RESPIRATORY_TRACT
  Filled 2022-05-13: qty 12

## 2022-05-13 MED ORDER — ONDANSETRON 8 MG PO TBDP
8.0000 mg | ORAL_TABLET | Freq: Once | ORAL | Status: AC
  Administered 2022-05-13: 8 mg via ORAL

## 2022-05-13 NOTE — ED Triage Notes (Signed)
Pt presents with c/o asthma/shortness of breath. Pt reports that last night he began to become short of breath and had to use his rescue inhaler. Pt reports his rescue inhaler is not working at this point. Pt is visibly working hard to breathe.

## 2022-05-13 NOTE — ED Notes (Signed)
Pt states understanding of dc instructions, importance of follow up, and prescription. Pt denies questions or concerns upon dc. Pt declined wheelchair assistance upon dc. Pt ambulated out of ed w/ steady gait. No belongings left in room upon dc.  

## 2022-05-13 NOTE — Discharge Instructions (Addendum)
We saw you in the ER for your asthma related complains. We gave you some breathing treatments in the ER, and seems like your symptoms have improved. Please take albuterol as needed every 4 hours. Please take the medications prescribed. Please refrain from smoking or smoke exposure. Please see a primary care doctor in 1 week. Return to the ER if your symptoms worsen.  
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
## 2022-05-27 ENCOUNTER — Ambulatory Visit: Payer: BC Managed Care – PPO | Admitting: Family Medicine

## 2022-05-27 ENCOUNTER — Telehealth: Payer: Self-pay | Admitting: Family Medicine

## 2022-05-27 NOTE — Telephone Encounter (Signed)
8.8.23 no show letter sent 

## 2022-10-31 ENCOUNTER — Emergency Department (HOSPITAL_COMMUNITY): Payer: Commercial Managed Care - HMO

## 2022-10-31 ENCOUNTER — Other Ambulatory Visit: Payer: Self-pay

## 2022-10-31 ENCOUNTER — Emergency Department (HOSPITAL_COMMUNITY)
Admission: EM | Admit: 2022-10-31 | Discharge: 2022-10-31 | Disposition: A | Discharge status: Home / Self Care | Payer: Commercial Managed Care - HMO | Attending: Student | Admitting: Student

## 2022-10-31 ENCOUNTER — Encounter (HOSPITAL_COMMUNITY): Payer: Self-pay

## 2022-10-31 DIAGNOSIS — J45901 Unspecified asthma with (acute) exacerbation: Secondary | ICD-10-CM | POA: Diagnosis not present

## 2022-10-31 DIAGNOSIS — Z7951 Long term (current) use of inhaled steroids: Secondary | ICD-10-CM | POA: Diagnosis not present

## 2022-10-31 DIAGNOSIS — R0602 Shortness of breath: Secondary | ICD-10-CM | POA: Diagnosis present

## 2022-10-31 MED ORDER — PREDNISONE 20 MG PO TABS
60.0000 mg | ORAL_TABLET | Freq: Once | ORAL | Status: AC
  Administered 2022-10-31: 60 mg via ORAL
  Filled 2022-10-31: qty 3

## 2022-10-31 MED ORDER — PREDNISONE 20 MG PO TABS: 40.0000 mg | ORAL_TABLET | Freq: Every day | ORAL | 0 refills | Status: DC

## 2022-10-31 MED ORDER — ALBUTEROL SULFATE HFA 108 (90 BASE) MCG/ACT IN AERS: 1.0000 | INHALATION_SPRAY | Freq: Four times a day (QID) | RESPIRATORY_TRACT | 0 refills | Status: AC | PRN

## 2022-10-31 MED ORDER — ALBUTEROL SULFATE HFA 108 (90 BASE) MCG/ACT IN AERS
2.0000 | INHALATION_SPRAY | RESPIRATORY_TRACT | Status: DC | PRN
  Filled 2022-10-31: qty 6.7

## 2022-10-31 MED ORDER — IPRATROPIUM-ALBUTEROL 0.5-2.5 (3) MG/3ML IN SOLN
3.0000 mL | Freq: Once | RESPIRATORY_TRACT | Status: AC
  Administered 2022-10-31: 3 mL via RESPIRATORY_TRACT
  Filled 2022-10-31: qty 3

## 2022-10-31 NOTE — Discharge Instructions (Addendum)
Please return to the ED with any new or worsening signs or symptoms Please take 40 mg of prednisone once daily for the next 4 days Please read attached guide concerning asthma Please pick up albuterol inhaler refill

## 2022-10-31 NOTE — ED Provider Notes (Signed)
[place] DEPT Provider Note   CSN: [id number] Arrival date & time: 10/31/22  8416     History  Chief Complaint  Patient presents with   Asthma   Shortness of Breath    Jeffery Anderson is a 22 y.o. male with medical history of asthma.  The patient presents to the ED for evaluation of shortness of breath.  Patient reports that for the last 2 days he has had increasing shortness of breath, wheezing especially on exertion.  Patient reports he has been attempting to use his albuterol inhaler which will slightly relieve his shortness of breath however symptoms seem to persist.  Patient reports that he has used his inhaler somewhat over the last 2 days that it is now empty.  Patient states that in the past steroids have helped his asthma exacerbations which she suspects is occurring.  The patient denies any fevers, nausea, vomiting, chest pain.  Patient denies URI symptoms.   Asthma Associated symptoms include shortness of breath. Pertinent negatives include no chest pain.  Shortness of Breath Associated symptoms: wheezing   Associated symptoms: no chest pain, no fever and no vomiting        Home Medications Prior to Admission medications   Medication Sig Start Date End Date Taking? Authorizing Provider  albuterol (VENTOLIN HFA) 108 (90 Base) MCG/ACT inhaler Inhale 1-2 puffs into the lungs every 6 (six) hours as needed for wheezing or shortness of breath. 10/31/22  Yes Azucena Cecil, PA-C  predniSONE (DELTASONE) 20 MG tablet Take 2 tablets (40 mg total) by mouth daily. 10/31/22  Yes Azucena Cecil, PA-C  diphenhydrAMINE (BENADRYL) 25 MG tablet Take 25 mg by mouth every 6 (six) hours as needed (post nasal drip).    [provider]  montelukast (SINGULAIR) 10 MG tablet Take 1 tablet (10 mg total) by mouth at bedtime. 01/29/22   Carlisle Cater, PA-C      Allergies    Patient has no known allergies.    Review of Systems   Review of  Systems  Constitutional:  Negative for fever.  Respiratory:  Positive for shortness of breath and wheezing.   Cardiovascular:  Negative for chest pain.  Gastrointestinal:  Negative for nausea and vomiting.  All other systems reviewed and are negative.   Physical Exam Updated Vital Signs BP (!) 130/100 (BP Location: Left Arm)   Pulse 62   Temp 97.8 F (36.6 C) (Oral)   Resp 11   Ht 5\' 8"  (1.727 m)   Wt 63.5 kg   SpO2 100%   BMI 21.29 kg/m  Physical Exam Vitals and nursing note reviewed.  Constitutional:      General: He is not in acute distress.    Appearance: Normal appearance. He is not ill-appearing, toxic-appearing or diaphoretic.  HENT:     Head: Normocephalic and atraumatic.     Nose: Nose normal. No congestion.     Mouth/Throat:     Mouth: Mucous membranes are moist.     Pharynx: Oropharynx is clear.  Eyes:     Extraocular Movements: Extraocular movements intact.     Conjunctiva/sclera: Conjunctivae normal.     Pupils: Pupils are equal, round, and reactive to light.  Cardiovascular:     Rate and Rhythm: Normal rate and regular rhythm.  Pulmonary:     Effort: Pulmonary effort is normal.     Breath sounds: Wheezing present.  Abdominal:     General: Abdomen is flat. Bowel sounds are normal.  Palpations: Abdomen is soft.     Tenderness: There is no abdominal tenderness.  Musculoskeletal:     Cervical back: Normal range of motion and neck supple.  Neurological:     Mental Status: He is alert.     ED Results / Procedures / Treatments   Labs (all labs ordered are listed, but only abnormal results are displayed) Labs Reviewed - No data to display  EKG None  Radiology DG Chest 2 View  Result Date: 10/31/2022 CLINICAL DATA:  22 year old male with shortness of breath and nausea. EXAM: CHEST - 2 VIEW COMPARISON:  Chest radiograph 05/13/2022 and earlier. FINDINGS: Large lung volumes, not significantly changed from last year. Normal cardiac size and  mediastinal contours. Visualized tracheal air column is within normal limits. Both lungs appear clear. No pneumothorax or pleural effusion. No osseous abnormality identified.  Negative visible bowel gas. IMPRESSION: Chronically large lung volumes but no acute cardiopulmonary abnormality. Electronically Signed   By: Genevie Ann M.D.   On: 10/31/2022 07:32    Procedures Procedures   Medications Ordered in ED Medications  predniSONE (DELTASONE) tablet 60 mg (60 mg Oral Given 10/31/22 0727)  ipratropium-albuterol (DUONEB) 0.5-2.5 (3) MG/3ML nebulizer solution 3 mL (3 mLs Nebulization Given 10/31/22 0728)  ipratropium-albuterol (DUONEB) 0.5-2.5 (3) MG/3ML nebulizer solution 3 mL (3 mLs Nebulization Given 10/31/22 0802)    ED Course/ Medical Decision Making/ A&P                           Medical Decision Making Amount and/or Complexity of Data Reviewed Radiology: ordered.  Risk Prescription drug management.   22 year old male presents to ED for evaluation of asthma exacerbation.  Please see HPI for further details.  On examination the patient is afebrile, nontachycardic.  The patient lung sounds have wheezing throughout, he is not hypoxic on room air.  Patient abdomen soft and compressible.  Patient neurological examination shows no focal neurodeficits.  Patient posterior oropharynx without erythema, exudate.  Patient provided duo nebulizer x 2, 60 mg oral prednisone.  Patient reports that his shortness of breath and wheezing have subsided after second DuoNeb utilizer.  The patient will be discharged home on 4 days of prednisone 40 mg.  The patient will have his albuterol inhaler refilled.  Patient advised to return to ED with any new or worsening symptoms, he voiced understanding.  Patient had all his questions answered to his satisfaction.  The patient is stable for discharge.  Final Clinical Impression(s) / ED Diagnoses Final diagnoses:  Exacerbation of asthma, unspecified asthma severity,  unspecified whether persistent    Rx / DC Orders ED Discharge Orders          Ordered    predniSONE (DELTASONE) 20 MG tablet  Daily        10/31/22 0847    albuterol (VENTOLIN HFA) 108 (90 Base) MCG/ACT inhaler  Every 6 hours PRN        10/31/22 0847              Azucena Cecil, PA-C 10/31/22 0847    Teressa Lower, MD 11/01/22 1909

## 2022-10-31 NOTE — ED Triage Notes (Signed)
Pt woke up and couldn't breathe. Pt reports a hx of asthma. Pt is wheezing. Placed on 2L O2.

## 2023-03-15 ENCOUNTER — Other Ambulatory Visit: Payer: Self-pay

## 2023-03-15 ENCOUNTER — Emergency Department (HOSPITAL_COMMUNITY)
Admission: EM | Admit: 2023-03-15 | Discharge: 2023-03-15 | Disposition: A | Discharge status: Home / Self Care | Payer: BC Managed Care – PPO | Attending: Emergency Medicine | Admitting: Emergency Medicine

## 2023-03-15 ENCOUNTER — Encounter (HOSPITAL_COMMUNITY): Payer: Self-pay

## 2023-03-15 DIAGNOSIS — J4541 Moderate persistent asthma with (acute) exacerbation: Secondary | ICD-10-CM | POA: Diagnosis not present

## 2023-03-15 DIAGNOSIS — R0602 Shortness of breath: Secondary | ICD-10-CM | POA: Diagnosis present

## 2023-03-15 MED ORDER — DEXAMETHASONE 4 MG PO TABS
10.0000 mg | ORAL_TABLET | Freq: Once | ORAL | Status: AC
  Administered 2023-03-15: 10 mg via ORAL
  Filled 2023-03-15: qty 1

## 2023-03-15 MED ORDER — ALBUTEROL SULFATE HFA 108 (90 BASE) MCG/ACT IN AERS
2.0000 | INHALATION_SPRAY | RESPIRATORY_TRACT | Status: DC | PRN
  Administered 2023-03-15: 2 via RESPIRATORY_TRACT
  Filled 2023-03-15: qty 6.7

## 2023-03-15 NOTE — ED Provider Notes (Signed)
[place] EMERGENCY DEPARTMENT AT [facility] Provider Note   CSN: [id number] Arrival date & time: 03/15/23  0033     History  Chief Complaint  Patient presents with   asthma attack    Jeffery Anderson is a 22 y.o. male.  22 yo M with a chief complaints of difficulty breathing.  The patient has been using his inhaler but has not had any relief.  Upon checking in at triage they realized he had no medicine left and gave him 2 puffs of albuterol and has had significant improvement.  He feels like he also would benefit from some steroids.  He denies fevers.  Said symptoms for a few days.  He tells me does not have a history of asthma he said he was exposed to some sort of chemical when he was a child.        Home Medications Prior to Admission medications   Medication Sig Start Date End Date Taking? Authorizing Provider  albuterol (VENTOLIN HFA) 108 (90 Base) MCG/ACT inhaler Inhale 1-2 puffs into the lungs every 6 (six) hours as needed for wheezing or shortness of breath. 10/31/22   Al Decant, PA-C  diphenhydrAMINE (BENADRYL) 25 MG tablet Take 25 mg by mouth every 6 (six) hours as needed (post nasal drip).    [provider]  montelukast (SINGULAIR) 10 MG tablet Take 1 tablet (10 mg total) by mouth at bedtime. 01/29/22   Renne Crigler, PA-C  predniSONE (DELTASONE) 20 MG tablet Take 2 tablets (40 mg total) by mouth daily. 10/31/22   Al Decant, PA-C      Allergies    Patient has no known allergies.    Review of Systems   Review of Systems  Physical Exam Updated Vital Signs BP 131/68   Pulse 97   Temp 98.2 F (36.8 C)   Resp 12   SpO2 91%  Physical Exam Vitals and nursing note reviewed.  Constitutional:      Appearance: He is well-developed.  HENT:     Head: Normocephalic and atraumatic.  Eyes:     Pupils: Pupils are equal, round, and reactive to light.  Neck:     Vascular: No JVD.  Cardiovascular:     Rate and Rhythm: Normal  rate and regular rhythm.     Heart sounds: No murmur heard.    No friction rub. No gallop.  Pulmonary:     Effort: No respiratory distress.     Breath sounds: No wheezing.  Abdominal:     General: There is no distension.     Tenderness: There is no abdominal tenderness. There is no guarding or rebound.  Musculoskeletal:        General: Normal range of motion.     Cervical back: Normal range of motion and neck supple.  Skin:    Coloration: Skin is not pale.     Findings: No rash.  Neurological:     Mental Status: He is alert and oriented to person, place, and time.  Psychiatric:        Behavior: Behavior normal.     ED Results / Procedures / Treatments   Labs (all labs ordered are listed, but only abnormal results are displayed) Labs Reviewed - No data to display  EKG None  Radiology No results found.  Procedures Procedures    Medications Ordered in ED Medications  albuterol (VENTOLIN HFA) 108 (90 Base) MCG/ACT inhaler 2 puff (2 puffs Inhalation Given 03/15/23 0052)  dexamethasone (DECADRON)  tablet 10 mg (has no administration in time range)    ED Course/ Medical Decision Making/ A&P                             Medical Decision Making Risk Prescription drug management.   22 yo M with a chief complaint of difficulty breathing.  He was given 2 puffs of albuterol in the triage process and feels much better.  Will give a dose of Decadron here.  Have him use the inhaler at home.  Have him follow-up with PCP in the office.  1:38 AM:  I have discussed the diagnosis/risks/treatment options with the patient.  Evaluation and diagnostic testing in the emergency department does not suggest an emergent condition requiring admission or immediate intervention beyond what has been performed at this time.  They will follow up with PCP. We also discussed returning to the ED immediately if new or worsening sx occur. We discussed the sx which are most concerning (e.g., sudden  worsening pain, fever, inability to tolerate by mouth) that necessitate immediate return. Medications administered to the patient during their visit and any new prescriptions provided to the patient are listed below.  Medications given during this visit Medications  albuterol (VENTOLIN HFA) 108 (90 Base) MCG/ACT inhaler 2 puff (2 puffs Inhalation Given 03/15/23 0052)  dexamethasone (DECADRON) tablet 10 mg (has no administration in time range)     The patient appears reasonably screen and/or stabilized for discharge and I doubt any other medical condition or other [facility] requiring further screening, evaluation, or treatment in the ED at this time prior to discharge.          Final Clinical Impression(s) / ED Diagnoses Final diagnoses:  Moderate persistent asthma with exacerbation    Rx / DC Orders ED Discharge Orders     None         Melene Plan, DO 03/15/23 0138

## 2023-03-15 NOTE — ED Triage Notes (Signed)
Pt. Arrives POV c/o an asthma attack. Pt. States that he has an albuterol inhaler and an epi inhaler, but they haven't been working. Both inhalers ar on 0. Pt. Endorses SOB and chest tightness. Pt. Also c/o back pain from "breathing hard" Expiratory wheezes ausculted.

## 2023-03-15 NOTE — Discharge Instructions (Signed)
Use your inhaler every 4 hours(6 puffs) while awake, return for sudden worsening shortness of breath, or if you need to use your inhaler more often.  ° °

## 2023-03-18 ENCOUNTER — Emergency Department (HOSPITAL_COMMUNITY)
Admission: EM | Admit: 2023-03-18 | Discharge: 2023-03-18 | Disposition: A | Discharge status: Home / Self Care | Payer: BC Managed Care – PPO | Attending: Emergency Medicine | Admitting: Emergency Medicine

## 2023-03-18 ENCOUNTER — Emergency Department (HOSPITAL_COMMUNITY): Payer: BC Managed Care – PPO

## 2023-03-18 ENCOUNTER — Other Ambulatory Visit: Payer: Self-pay

## 2023-03-18 ENCOUNTER — Encounter (HOSPITAL_COMMUNITY): Payer: Self-pay

## 2023-03-18 DIAGNOSIS — J4541 Moderate persistent asthma with (acute) exacerbation: Secondary | ICD-10-CM | POA: Diagnosis not present

## 2023-03-18 DIAGNOSIS — R0602 Shortness of breath: Secondary | ICD-10-CM | POA: Diagnosis present

## 2023-03-18 LAB — BASIC METABOLIC PANEL
Anion gap: 10 (ref 5–15)
BUN: 11 mg/dL (ref 6–20)
CO2: 22 mmol/L (ref 22–32)
Calcium: 9.3 mg/dL (ref 8.9–10.3)
Chloride: 107 mmol/L (ref 98–111)
Creatinine, Ser: 0.9 mg/dL (ref 0.61–1.24)
GFR, Estimated: >60 mL/min (ref >60)
Glucose, Bld: 86 mg/dL (ref 70–99)
Potassium: 3.4 mmol/L — ABNORMAL LOW (ref 3.5–5.1)
Sodium: 139 mmol/L (ref 135–145)

## 2023-03-18 MED ORDER — PREDNISONE 20 MG PO TABS: ORAL_TABLET | ORAL | 0 refills | Status: DC

## 2023-03-18 MED ORDER — MAGNESIUM SULFATE 2 GM/50ML IV SOLN
2.0000 g | Freq: Once | INTRAVENOUS | Status: AC
  Administered 2023-03-18: 2 g via INTRAVENOUS
  Filled 2023-03-18: qty 50

## 2023-03-18 MED ORDER — IPRATROPIUM BROMIDE 0.02 % IN SOLN
RESPIRATORY_TRACT | Status: AC
  Filled 2023-03-18: qty 2.5

## 2023-03-18 MED ORDER — IPRATROPIUM BROMIDE 0.02 % IN SOLN
0.5000 mg | Freq: Once | RESPIRATORY_TRACT | Status: AC
  Administered 2023-03-18: 0.5 mg via RESPIRATORY_TRACT
  Filled 2023-03-18: qty 2.5

## 2023-03-18 MED ORDER — ALBUTEROL SULFATE (2.5 MG/3ML) 0.083% IN NEBU
10.0000 mg/h | INHALATION_SOLUTION | Freq: Once | RESPIRATORY_TRACT | Status: AC
  Administered 2023-03-18: 10 mg/h via RESPIRATORY_TRACT
  Filled 2023-03-18: qty 12

## 2023-03-18 MED ORDER — METHYLPREDNISOLONE SODIUM SUCC 125 MG IJ SOLR
125.0000 mg | Freq: Once | INTRAMUSCULAR | Status: AC
  Administered 2023-03-18: 125 mg via INTRAVENOUS
  Filled 2023-03-18: qty 2

## 2023-03-18 NOTE — ED Provider Notes (Signed)
[place] EMERGENCY DEPARTMENT AT [facility] Provider Note   CSN: [id number] Arrival date & time: 03/18/23  0435     History  No chief complaint on file.   Jeffery Anderson is a 22 y.o. male.  The history is provided by the patient and medical records. No language interpreter was used.     22 year old male significant history of asthma presenting to the ER with complaints of shortness of breath.  Patient report for the past 3 days he has had progressive worsening shortness of breath.  He is using his rescue inhaler on almost an hourly basis.  His symptoms seem to be worse at nighttime when he feels more congested.  Does not endorse any fever or chills no runny nose or sneezing no nausea vomiting diarrhea.  Pain is just only when he takes deep breath.  This felt similar to prior asthma exacerbations that he has had in the past usually improved with prednisone.  Patient has had prior asthma exacerbation requiring intubation and hospital admission in the past.  He denies any environmental changes but felt pollen may have affected his symptoms.  He reports occasional tobacco use.  No prior history of PE or DVT.   Home Medications Prior to Admission medications   Medication Sig Start Date End Date Taking? Authorizing Provider  albuterol (VENTOLIN HFA) 108 (90 Base) MCG/ACT inhaler Inhale 1-2 puffs into the lungs every 6 (six) hours as needed for wheezing or shortness of breath. 10/31/22   Al Decant, PA-C  diphenhydrAMINE (BENADRYL) 25 MG tablet Take 25 mg by mouth every 6 (six) hours as needed (post nasal drip).    [provider]  montelukast (SINGULAIR) 10 MG tablet Take 1 tablet (10 mg total) by mouth at bedtime. 01/29/22   Renne Crigler, PA-C  predniSONE (DELTASONE) 20 MG tablet Take 2 tablets (40 mg total) by mouth daily. 10/31/22   Al Decant, PA-C      Allergies    Patient has no known allergies.    Review of Systems   Review of Systems   All other systems reviewed and are negative.   Physical Exam Updated Vital Signs There were no vitals taken for this visit. Physical Exam Vitals and nursing note reviewed.  Constitutional:      General: He is not in acute distress.    Appearance: He is well-developed.  HENT:     Head: Atraumatic.  Eyes:     Conjunctiva/sclera: Conjunctivae normal.  Pulmonary:     Breath sounds: Wheezing present. No rhonchi or rales.  Chest:     Chest wall: No tenderness.  Abdominal:     Palpations: Abdomen is soft.     Tenderness: There is no abdominal tenderness.  Musculoskeletal:     Cervical back: Neck supple.  Skin:    Findings: No rash.  Neurological:     Mental Status: He is alert.     ED Results / Procedures / Treatments   Labs (all labs ordered are listed, but only abnormal results are displayed) Labs Reviewed - No data to display  EKG None  Radiology No results found.  Procedures Procedures    Medications Ordered in ED Medications - No data to display  ED Course/ Medical Decision Making/ A&P                             Medical Decision Making  BP (!) 151/101   Pulse 97  Temp 97.8 F (36.6 C)   Resp 20   Ht 5\' 8"  (1.727 m)   Wt 59 kg   SpO2 96%   BMI 19.77 kg/m   40:47 AM 22 year old male significant history of asthma presenting to the ER with complaints of shortness of breath.  Patient report for the past 3 days he has had progressive worsening shortness of breath.  He is using his rescue inhaler on almost an hourly basis.  His symptoms seem to be worse at nighttime when he feels more congested.  Does not endorse any fever or chills no runny nose or sneezing no nausea vomiting diarrhea.  Pain is just only when he takes deep breath.  This felt similar to prior asthma exacerbations that he has had in the past usually improved with prednisone.  Patient has had prior asthma exacerbation requiring intubation and hospital admission in the past.  He denies any  environmental changes but felt pollen may have affected his symptoms.  He reports occasional tobacco use.  No prior history of PE or DVT.  On exam, patient appears in some mild respiratory discomfort.  Occasionally taking deep breath.  Otherwise he is able to finish complete sentences.  Heart with normal rate and rhythm, lungs with expiratory wheezes and decreased breath sounds.  Abdomen soft nontender no peripheral edema noted.  -Labs ordered, independently viewed and interpreted by me.  Labs remarkable for reassuring electrolytes panel -The patient was maintained on a cardiac monitor.  I personally viewed and interpreted the cardiac monitored which showed an underlying rhythm of: NSR -Imaging independently viewed and interpreted by me and I agree with radiologist's interpretation.  Result remarkable for CXR without acute changes -This patient presents to the ED for concern of sob, this involves an extensive number of treatment options, and is a complaint that carries with it a high risk of complications and morbidity.  The differential diagnosis includes asthma exacerbation, PNA, COPD, reactive airway disease, PE, anemia -Co morbidities that complicate the patient evaluation includes asthma -Treatment includes mag, albuterol, atrovent, solumedrol -Reevaluation of the patient after these medicines showed that the patient improved -PCP office notes or outside notes reviewed -Escalation to admission/observation considered: patients feels much better, is comfortable with discharge, and will follow up with PCP -Prescription medication considered, patient comfortable with prednisone -Social Determinant of Health considered  6:44 AM On reexam patient report feeling much better.  Ambulating well maintaining oxygen greater than 92% on room air.  Patient felt comfortable going home.  Will discharge home with prednisone taper.  Return precaution given.  Patient otherwise stable for  discharge.         Final Clinical Impression(s) / ED Diagnoses Final diagnoses:  Moderate persistent asthma with exacerbation    Rx / DC Orders ED Discharge Orders          Ordered    predniSONE (DELTASONE) 20 MG tablet        03/18/23 0614              Fayrene Helper, PA-C 03/18/23 1478    Sabas Sous, MD 03/18/23 [phone]

## 2023-03-18 NOTE — ED Notes (Signed)
Patient given discharge instructions and follow up care. Patient verbalized understanding. IV removed with catheter intact. Patient ambulatory out of ED. 

## 2023-03-18 NOTE — ED Triage Notes (Signed)
Pt arrives c/o asthma exacerbation. C/o SOB, wheezing. Asthma symptoms have been worse the past few days. Took albuterol inhaler prior to arrival without much relief. States that he's used his inhaler around 20 times today. Was seen here a few days ago and discharged without steroids. States hx of admission and being intubated due to asthma before.

## 2023-03-18 NOTE — ED Notes (Signed)
Pt was ambulatory from one end of the hall to the other end and o2 stayed between 93-95 MD advised

## 2023-09-15 ENCOUNTER — Other Ambulatory Visit: Payer: Self-pay

## 2023-09-15 ENCOUNTER — Encounter (HOSPITAL_COMMUNITY): Payer: Self-pay

## 2023-09-15 ENCOUNTER — Emergency Department (HOSPITAL_COMMUNITY)
Admission: EM | Admit: 2023-09-15 | Discharge: 2023-09-15 | Disposition: A | Discharge status: Home / Self Care | Payer: BC Managed Care – PPO | Attending: Emergency Medicine | Admitting: Emergency Medicine

## 2023-09-15 DIAGNOSIS — R112 Nausea with vomiting, unspecified: Secondary | ICD-10-CM | POA: Insufficient documentation

## 2023-09-15 DIAGNOSIS — R7401 Elevation of levels of liver transaminase levels: Secondary | ICD-10-CM | POA: Diagnosis not present

## 2023-09-15 DIAGNOSIS — D72829 Elevated white blood cell count, unspecified: Secondary | ICD-10-CM | POA: Diagnosis not present

## 2023-09-15 LAB — CBC WITH DIFFERENTIAL/PLATELET
Abs Immature Granulocytes: 0.05 10*3/uL (ref 0.00–0.07)
Basophils Absolute: 0.1 10*3/uL (ref 0.0–0.1)
Basophils Relative: 0 %
Eosinophils Absolute: 0 10*3/uL (ref 0.0–0.5)
Eosinophils Relative: 0 %
HCT: 43.8 % (ref 39.0–52.0)
Hemoglobin: 15 g/dL (ref 13.0–17.0)
Immature Granulocytes: 0 %
Lymphocytes Relative: 6 %
Lymphs Abs: 0.8 10*3/uL (ref 0.7–4.0)
MCH: 31 pg (ref 26.0–34.0)
MCHC: 34.2 g/dL (ref 30.0–36.0)
MCV: 90.5 fL (ref 80.0–100.0)
Monocytes Absolute: 1 10*3/uL (ref 0.1–1.0)
Monocytes Relative: 7 %
Neutro Abs: 12.4 10*3/uL — ABNORMAL HIGH (ref 1.7–7.7)
Neutrophils Relative %: 87 %
Platelets: 340 10*3/uL (ref 150–400)
RBC: 4.84 MIL/uL (ref 4.22–5.81)
RDW: 11.7 % (ref 11.5–15.5)
WBC: 14.3 10*3/uL — ABNORMAL HIGH (ref 4.0–10.5)
nRBC: 0 % (ref 0.0–0.2)

## 2023-09-15 LAB — COMPREHENSIVE METABOLIC PANEL
ALT: 86 U/L — ABNORMAL HIGH (ref 0–44)
AST: 65 U/L — ABNORMAL HIGH (ref 15–41)
Albumin: 4.7 g/dL (ref 3.5–5.0)
Alkaline Phosphatase: 52 U/L (ref 38–126)
Anion gap: 11 (ref 5–15)
BUN: 10 mg/dL (ref 6–20)
CO2: 22 mmol/L (ref 22–32)
Calcium: 9.7 mg/dL (ref 8.9–10.3)
Chloride: 102 mmol/L (ref 98–111)
Creatinine, Ser: 0.78 mg/dL (ref 0.61–1.24)
GFR, Estimated: >60 mL/min (ref >60)
Glucose, Bld: 114 mg/dL — ABNORMAL HIGH (ref 70–99)
Potassium: 3.8 mmol/L (ref 3.5–5.1)
Sodium: 135 mmol/L (ref 135–145)
Total Bilirubin: 1.1 mg/dL (ref <1.2)
Total Protein: 7.8 g/dL (ref 6.5–8.1)

## 2023-09-15 LAB — LIPASE, BLOOD: Lipase: 38 U/L (ref 11–51)

## 2023-09-15 MED ORDER — ONDANSETRON HCL 4 MG/2ML IJ SOLN
4.0000 mg | Freq: Once | INTRAMUSCULAR | Status: AC
  Administered 2023-09-15: 4 mg via INTRAVENOUS
  Filled 2023-09-15: qty 2

## 2023-09-15 MED ORDER — ALUM & MAG HYDROXIDE-SIMETH 200-200-20 MG/5ML PO SUSP
30.0000 mL | Freq: Once | ORAL | Status: AC
  Administered 2023-09-15: 30 mL via ORAL
  Filled 2023-09-15: qty 30

## 2023-09-15 MED ORDER — ONDANSETRON HCL 4 MG PO TABS: 4.0000 mg | ORAL_TABLET | Freq: Three times a day (TID) | ORAL | 0 refills | Status: DC | PRN

## 2023-09-15 MED ORDER — FAMOTIDINE IN NACL 20-0.9 MG/50ML-% IV SOLN
20.0000 mg | Freq: Once | INTRAVENOUS | Status: AC
  Administered 2023-09-15: 20 mg via INTRAVENOUS
  Filled 2023-09-15: qty 50

## 2023-09-15 MED ORDER — OMEPRAZOLE 20 MG PO CPDR: 20.0000 mg | DELAYED_RELEASE_CAPSULE | Freq: Every day | ORAL | 2 refills | Status: DC

## 2023-09-15 NOTE — ED Triage Notes (Addendum)
Pt arrived pov reporting he ate Cava last night before bed and woke up 3am with nausea and vomiting. States he had a few episodes and the last time he threw up, he saw blood. No abdominal pain. Denies any other symptoms.

## 2023-09-15 NOTE — ED Notes (Signed)
Pt given water to drink.

## 2023-09-15 NOTE — ED Provider Notes (Signed)
[place] EMERGENCY DEPARTMENT AT [facility] Provider Note   CSN: [id number] Arrival date & time: 09/15/23  4098     History  Chief Complaint  Patient presents with   Abdominal Pain   Emesis    Jeffery Anderson is a 22 y.o. male presented to the ED with abdominal pain and emesis.  Patient reports that he ate, yesterday evening and woke up around 3 AM with nausea and vomiting.  He has had vomiting or dry heaving episodes every 20 minutes, now with blood-tinged sputum and darker colored coffee-ground emesis.  He says he feels sore in his lower throat.  Denies abdominal pain.  Reports he had 2 looser bowel movements last night but no persistent diarrhea.  Denies any history of abdominal surgery.  He does report history of likely reflux or gastritis, frequently having acid reflux symptoms at night.  He also reports that he drinks 2-3 shots of liquor on a near daily basis, including 3 shots of tequila yesterday.  He says has been ongoing for several months or years, and he does not try taking breaks due to his drinking.  He does smoke marijuana but not on a near daily basis.  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  HPI     Home Medications Prior to Admission medications   Medication Sig Start Date End Date Taking? Authorizing Provider  albuterol (VENTOLIN HFA) 108 (90 Base) MCG/ACT inhaler Inhale 1-2 puffs into the lungs every 6 (six) hours as needed for wheezing or shortness of breath. 10/31/22  Yes Al Decant, PA-C  diphenhydrAMINE (BENADRYL) 25 MG tablet Take 25 mg by mouth every 6 (six) hours as needed (post nasal drip).   Yes [provider]  omeprazole (PRILOSEC) 20 MG capsule Take 1 capsule (20 mg total) by mouth daily. 09/15/23 10/15/23 Yes Jerlyn Pain, Kermit Balo, MD  ondansetron (ZOFRAN) 4 MG tablet Take 1 tablet (4 mg total) by mouth every 8 (eight) hours as needed for  up to 12 doses for nausea or vomiting. 09/15/23  Yes Terald Sleeper, MD  predniSONE (DELTASONE) 20 MG tablet 3 tabs po day one, then 2 tabs daily x 4 days Patient not taking: Reported on 09/15/2023 03/18/23   Fayrene Helper, PA-C      Allergies    Patient has no known allergies.    Review of Systems   Review of Systems  Physical Exam Updated Vital Signs BP 138/74   Pulse (!) 56   Temp 98 F (36.7 C)   Resp 15   SpO2 98%  Physical Exam Constitutional:      General: He is not in acute distress. HENT:     Head: Normocephalic and atraumatic.  Eyes:     Conjunctiva/sclera: Conjunctivae normal.     Pupils: Pupils are equal, round, and reactive to light.  Cardiovascular:     Rate and Rhythm: Normal rate and regular rhythm.  Pulmonary:     Effort: Pulmonary effort is normal. No respiratory distress.  Abdominal:     General: There is no distension.     Tenderness: There is no abdominal tenderness.  Skin:    General: Skin is warm and dry.  Neurological:     General: No focal deficit present.     Mental Status: He is alert. Mental status is at baseline.  Psychiatric:        Mood and Affect: Mood  normal.        Behavior: Behavior normal.     ED Results / Procedures / Treatments   Labs (all labs ordered are listed, but only abnormal results are displayed) Labs Reviewed  COMPREHENSIVE METABOLIC PANEL - Abnormal; Notable for the following components:      Result Value   Glucose, Bld 114 (*)    AST 65 (*)    ALT 86 (*)    All other components within normal limits  CBC WITH DIFFERENTIAL/PLATELET - Abnormal; Notable for the following components:   WBC 14.3 (*)    Neutro Abs 12.4 (*)    All other components within normal limits  LIPASE, BLOOD    EKG None  Radiology No results found.  Procedures Procedures    Medications Ordered in ED Medications  famotidine (PEPCID) IVPB 20 mg premix (0 mg Intravenous Stopped 09/15/23 0915)  ondansetron (ZOFRAN) injection 4 mg  (4 mg Intravenous Given 09/15/23 0846)  alum & mag hydroxide-simeth (MAALOX/MYLANTA) 200-200-20 MG/5ML suspension 30 mL (30 mLs Oral Given 09/15/23 0847)    ED Course/ Medical Decision Making/ A&P Clinical Course as of 09/15/23 2023  Tue Sep 15, 2023  1205 Patient is feeling significantly better and tolerated p.o. and is ready to be discharged. [MT]    Clinical Course User Index [MT] Wendy Mikles, Kermit Balo, MD                                 Medical Decision Making Amount and/or Complexity of Data Reviewed Labs: ordered.  Risk OTC drugs. Prescription drug management.   This patient presents to the ED with concern for nausea vomiting and epigastric pain. This involves an extensive number of treatment options, and is a complaint that carries with it a high risk of complications and morbidity.  The differential diagnosis includes gastritis vs reflux vs esophagitis vs mallory weiss tear vs pancreatitis vs other  Co-morbidities that complicate the patient evaluation: frequent etoh drinking at risk for gastritis/liver disease and pancreatitis   I ordered and personally interpreted labs.  The pertinent results include:  WBC 14.3 likely reactive from vomiting/gastritis.  Hgb wnl.  Very mild transaminitis likely 2/2 chronic etoh drinking, but otherwise labs unremarkable    I ordered medication including IV gastritis medications  I have reviewed the patients home medicines and have made adjustments as needed  Test Considered: with no focal abdominal tenderness and pain free, no indication for emergent CT or ultrasound of the abdomen.  Doubt testicular torsion.  After the interventions noted above, I reevaluated the patient and found that they have: improved  Social Determinants of Health:counseled on alcohol cessation  Dispostion:  After consideration of the diagnostic results and the patients response to treatment, I feel that the patent would benefit from outpatient follow up   Pain  improved with GI medications - suspect gastric source of symptoms.  Will start on PPI, adjust home diet, and advised GI follow up if no improvement in the future.        Final Clinical Impression(s) / ED Diagnoses Final diagnoses:  Nausea and vomiting, unspecified vomiting type    Rx / DC Orders ED Discharge Orders          Ordered    ondansetron (ZOFRAN) 4 MG tablet  Every 8 hours PRN        09/15/23 1206    omeprazole (PRILOSEC) 20 MG capsule  Daily  09/15/23 1206              Terald Sleeper, MD 09/15/23 2023

## 2023-10-05 ENCOUNTER — Emergency Department (HOSPITAL_COMMUNITY)
Admission: EM | Admit: 2023-10-05 | Discharge: 2023-10-05 | Disposition: A | Discharge status: Home / Self Care | Payer: BC Managed Care – PPO | Attending: Emergency Medicine | Admitting: Emergency Medicine

## 2023-10-05 ENCOUNTER — Other Ambulatory Visit: Payer: Self-pay

## 2023-10-05 ENCOUNTER — Encounter (HOSPITAL_COMMUNITY): Payer: Self-pay | Admitting: Emergency Medicine

## 2023-10-05 DIAGNOSIS — R0981 Nasal congestion: Secondary | ICD-10-CM | POA: Insufficient documentation

## 2023-10-05 DIAGNOSIS — K2970 Gastritis, unspecified, without bleeding: Secondary | ICD-10-CM

## 2023-10-05 DIAGNOSIS — R112 Nausea with vomiting, unspecified: Secondary | ICD-10-CM | POA: Diagnosis present

## 2023-10-05 LAB — COMPREHENSIVE METABOLIC PANEL
ALT: 170 U/L — ABNORMAL HIGH (ref 0–44)
AST: 209 U/L — ABNORMAL HIGH (ref 15–41)
Albumin: 5.3 g/dL — ABNORMAL HIGH (ref 3.5–5.0)
Alkaline Phosphatase: 73 U/L (ref 38–126)
Anion gap: 18 — ABNORMAL HIGH (ref 5–15)
BUN: 10 mg/dL (ref 6–20)
CO2: 22 mmol/L (ref 22–32)
Calcium: 9.3 mg/dL (ref 8.9–10.3)
Chloride: 94 mmol/L — ABNORMAL LOW (ref 98–111)
Creatinine, Ser: 0.85 mg/dL (ref 0.61–1.24)
GFR, Estimated: >60 mL/min (ref >60)
Glucose, Bld: 115 mg/dL — ABNORMAL HIGH (ref 70–99)
Potassium: 3.4 mmol/L — ABNORMAL LOW (ref 3.5–5.1)
Sodium: 134 mmol/L — ABNORMAL LOW (ref 135–145)
Total Bilirubin: 1.2 mg/dL — ABNORMAL HIGH (ref <1.2)
Total Protein: 8.6 g/dL — ABNORMAL HIGH (ref 6.5–8.1)

## 2023-10-05 LAB — CBC WITH DIFFERENTIAL/PLATELET
Abs Immature Granulocytes: 0.02 10*3/uL (ref 0.00–0.07)
Basophils Absolute: 0 10*3/uL (ref 0.0–0.1)
Basophils Relative: 1 %
Eosinophils Absolute: 0 10*3/uL (ref 0.0–0.5)
Eosinophils Relative: 1 %
HCT: 45.3 % (ref 39.0–52.0)
Hemoglobin: 16.3 g/dL (ref 13.0–17.0)
Immature Granulocytes: 0 %
Lymphocytes Relative: 21 %
Lymphs Abs: 1.8 10*3/uL (ref 0.7–4.0)
MCH: 32 pg (ref 26.0–34.0)
MCHC: 36 g/dL (ref 30.0–36.0)
MCV: 88.8 fL (ref 80.0–100.0)
Monocytes Absolute: 0.7 10*3/uL (ref 0.1–1.0)
Monocytes Relative: 9 %
Neutro Abs: 5.6 10*3/uL (ref 1.7–7.7)
Neutrophils Relative %: 68 %
Platelets: 374 10*3/uL (ref 150–400)
RBC: 5.1 MIL/uL (ref 4.22–5.81)
RDW: 11.9 % (ref 11.5–15.5)
WBC: 8.2 10*3/uL (ref 4.0–10.5)
nRBC: 0 % (ref 0.0–0.2)

## 2023-10-05 LAB — LIPASE, BLOOD: Lipase: 42 U/L (ref 11–51)

## 2023-10-05 MED ORDER — SODIUM CHLORIDE 0.9 % IV SOLN: INTRAVENOUS | Status: DC

## 2023-10-05 MED ORDER — PANTOPRAZOLE SODIUM 40 MG PO TBEC: 40.0000 mg | DELAYED_RELEASE_TABLET | Freq: Every day | ORAL | 2 refills | Status: DC

## 2023-10-05 MED ORDER — ONDANSETRON HCL 4 MG/2ML IJ SOLN
4.0000 mg | Freq: Once | INTRAMUSCULAR | Status: AC
  Administered 2023-10-05: 4 mg via INTRAVENOUS
  Filled 2023-10-05: qty 2

## 2023-10-05 MED ORDER — SODIUM CHLORIDE 0.9 % IV BOLUS
1000.0000 mL | Freq: Once | INTRAVENOUS | Status: AC
  Administered 2023-10-05: 1000 mL via INTRAVENOUS

## 2023-10-05 NOTE — ED Provider Notes (Signed)
[place] EMERGENCY DEPARTMENT AT [facility] Provider Note   CSN: [id number] Arrival date & time: 10/05/23  8295     History  Chief Complaint  Patient presents with   URI    Jeffery Anderson is a 22 y.o. male.  22 year old male presents with nausea vomiting x 24 hours.  Concern that he may have the flu.  Denies any fever.  No diarrhea.  His emesis has been nonbilious and though he notes some coffee-ground's.  He states he has had slight congestion but no severe cough.  He has not been short of breath.  Denies any history of GI bleeding.  Patient states he drinks about a bottle of vodka per day.  Last drink was over 24 hours ago.  Denies any prior history of pancreatitis       Home Medications Prior to Admission medications   Medication Sig Start Date End Date Taking? Authorizing Provider  albuterol (VENTOLIN HFA) 108 (90 Base) MCG/ACT inhaler Inhale 1-2 puffs into the lungs every 6 (six) hours as needed for wheezing or shortness of breath. 10/31/22   Al Decant, PA-C  diphenhydrAMINE (BENADRYL) 25 MG tablet Take 25 mg by mouth every 6 (six) hours as needed (post nasal drip).    [provider]  omeprazole (PRILOSEC) 20 MG capsule Take 1 capsule (20 mg total) by mouth daily. 09/15/23 10/15/23  Terald Sleeper, MD  ondansetron (ZOFRAN) 4 MG tablet Take 1 tablet (4 mg total) by mouth every 8 (eight) hours as needed for up to 12 doses for nausea or vomiting. 09/15/23   Terald Sleeper, MD  predniSONE (DELTASONE) 20 MG tablet 3 tabs po day one, then 2 tabs daily x 4 days Patient not taking: Reported on 09/15/2023 03/18/23   Fayrene Helper, PA-C      Allergies    Patient has no known allergies.    Review of Systems   Review of Systems  All other systems reviewed and are negative.   Physical Exam Updated Vital Signs BP (!) 150/101 (BP Location: Right Arm)   Pulse (!) 101   Temp 97.9 F (36.6 C)   Resp 16   SpO2 99%  Physical Exam Vitals and  nursing note reviewed.  Constitutional:      General: He is not in acute distress.    Appearance: Normal appearance. He is well-developed. He is not toxic-appearing.  HENT:     Head: Normocephalic and atraumatic.  Eyes:     General: Lids are normal.     Conjunctiva/sclera: Conjunctivae normal.     Pupils: Pupils are equal, round, and reactive to light.  Neck:     Thyroid: No thyroid mass.     Trachea: No tracheal deviation.  Cardiovascular:     Rate and Rhythm: Normal rate and regular rhythm.     Heart sounds: Normal heart sounds. No murmur heard.    No gallop.  Pulmonary:     Effort: Pulmonary effort is normal. No respiratory distress.     Breath sounds: Normal breath sounds. No stridor. No decreased breath sounds, wheezing, rhonchi or rales.  Abdominal:     General: There is no distension.     Palpations: Abdomen is soft.     Tenderness: There is no abdominal tenderness. There is no rebound.  Musculoskeletal:        General: No tenderness. Normal range of motion.     Cervical back: Normal range of motion and neck supple.  Skin:  General: Skin is warm and dry.     Findings: No abrasion or rash.  Neurological:     Mental Status: He is alert and oriented to person, place, and time. Mental status is at baseline.     GCS: GCS eye subscore is 4. GCS verbal subscore is 5. GCS motor subscore is 6.     Cranial Nerves: No cranial nerve deficit.     Sensory: No sensory deficit.     Motor: Motor function is intact.  Psychiatric:        Attention and Perception: Attention normal.        Speech: Speech normal.        Behavior: Behavior normal.     ED Results / Procedures / Treatments   Labs (all labs ordered are listed, but only abnormal results are displayed) Labs Reviewed  COMPREHENSIVE METABOLIC PANEL - Abnormal; Notable for the following components:      Result Value   Sodium 134 (*)    Potassium 3.4 (*)    Chloride 94 (*)    Glucose, Bld 115 (*)    Total Protein 8.6  (*)    Albumin 5.3 (*)    AST 209 (*)    ALT 170 (*)    Total Bilirubin 1.2 (*)    Anion gap 18 (*)    All other components within normal limits  CBC WITH DIFFERENTIAL/PLATELET  LIPASE, BLOOD  URINALYSIS, ROUTINE W REFLEX MICROSCOPIC    EKG None  Radiology No results found.  Procedures Procedures    Medications Ordered in ED Medications  0.9 %  sodium chloride infusion (has no administration in time range)  sodium chloride 0.9 % bolus 1,000 mL (has no administration in time range)  ondansetron (ZOFRAN) injection 4 mg (has no administration in time range)    ED Course/ Medical Decision Making/ A&P                                 Medical Decision Making Amount and/or Complexity of Data Reviewed Labs: ordered.  Risk Prescription drug management.    Patient given IV fluids here as well as Zofran and feels better.  Patient drinks daily but no evidence of pancreatitis at this time.  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  Low suspicion for acute upper GI bleed.  He has had no dark stools.  No hematemesis here.  Plan will be to place on PPI.  Patient informed to decrease his alcohol intake.  Will follow-up with his doctor as needed        Final Clinical Impression(s) / ED Diagnoses Final diagnoses:  None    Rx / DC Orders ED Discharge Orders     None         Lorre Nick, MD 10/05/23 [phone]

## 2023-10-05 NOTE — ED Notes (Signed)
Pt states cramping feeling in both hands

## 2023-10-05 NOTE — ED Notes (Signed)
Pt unable to provide urine at this time

## 2023-10-05 NOTE — ED Triage Notes (Signed)
Pt states he feels weak and has been vomiting for a day. Nasal congestion but no fever or cough or belly pain. States he feels dehydrated and gets like 1 hour of sleep then is awaken to vomit again. States he thinks he has the flu.

## 2023-12-13 ENCOUNTER — Other Ambulatory Visit: Payer: Self-pay

## 2023-12-13 ENCOUNTER — Encounter (HOSPITAL_COMMUNITY): Payer: Self-pay | Admitting: Emergency Medicine

## 2023-12-13 ENCOUNTER — Inpatient Hospital Stay (HOSPITAL_COMMUNITY)
Admission: EM | Admit: 2023-12-13 | Discharge: 2023-12-13 | DRG: 894 | Discharge status: Against Medical Advice | Payer: BC Managed Care – PPO | Attending: Internal Medicine | Admitting: Internal Medicine

## 2023-12-13 DIAGNOSIS — F10930 Alcohol use, unspecified with withdrawal, uncomplicated: Secondary | ICD-10-CM

## 2023-12-13 DIAGNOSIS — J45909 Unspecified asthma, uncomplicated: Secondary | ICD-10-CM | POA: Diagnosis present

## 2023-12-13 DIAGNOSIS — E874 Mixed disorder of acid-base balance: Secondary | ICD-10-CM | POA: Diagnosis present

## 2023-12-13 DIAGNOSIS — R17 Unspecified jaundice: Secondary | ICD-10-CM | POA: Diagnosis present

## 2023-12-13 DIAGNOSIS — F419 Anxiety disorder, unspecified: Secondary | ICD-10-CM | POA: Diagnosis present

## 2023-12-13 DIAGNOSIS — F1010 Alcohol abuse, uncomplicated: Secondary | ICD-10-CM | POA: Diagnosis present

## 2023-12-13 DIAGNOSIS — Z1152 Encounter for screening for COVID-19: Secondary | ICD-10-CM | POA: Diagnosis not present

## 2023-12-13 DIAGNOSIS — F10132 Alcohol abuse with withdrawal with perceptual disturbance: Secondary | ICD-10-CM | POA: Diagnosis present

## 2023-12-13 DIAGNOSIS — R7401 Elevation of levels of liver transaminase levels: Secondary | ICD-10-CM | POA: Diagnosis present

## 2023-12-13 DIAGNOSIS — D75839 Thrombocytosis, unspecified: Secondary | ICD-10-CM | POA: Diagnosis present

## 2023-12-13 DIAGNOSIS — Z5329 Procedure and treatment not carried out because of patient's decision for other reasons: Secondary | ICD-10-CM | POA: Diagnosis present

## 2023-12-13 DIAGNOSIS — Z833 Family history of diabetes mellitus: Secondary | ICD-10-CM

## 2023-12-13 DIAGNOSIS — E876 Hypokalemia: Secondary | ICD-10-CM | POA: Diagnosis present

## 2023-12-13 DIAGNOSIS — E871 Hypo-osmolality and hyponatremia: Secondary | ICD-10-CM | POA: Diagnosis present

## 2023-12-13 DIAGNOSIS — F10939 Alcohol use, unspecified with withdrawal, unspecified: Secondary | ICD-10-CM | POA: Diagnosis present

## 2023-12-13 DIAGNOSIS — Z825 Family history of asthma and other chronic lower respiratory diseases: Secondary | ICD-10-CM

## 2023-12-13 DIAGNOSIS — R1110 Vomiting, unspecified: Secondary | ICD-10-CM | POA: Diagnosis present

## 2023-12-13 DIAGNOSIS — F10932 Alcohol use, unspecified with withdrawal with perceptual disturbance: Principal | ICD-10-CM

## 2023-12-13 LAB — CBC WITH DIFFERENTIAL/PLATELET
Abs Immature Granulocytes: 0.07 10*3/uL (ref 0.00–0.07)
Basophils Absolute: 0.1 10*3/uL (ref 0.0–0.1)
Basophils Relative: 1 %
Eosinophils Absolute: 0 10*3/uL (ref 0.0–0.5)
Eosinophils Relative: 0 %
HCT: 45.9 % (ref 39.0–52.0)
Hemoglobin: 16.4 g/dL (ref 13.0–17.0)
Immature Granulocytes: 1 %
Lymphocytes Relative: 8 %
Lymphs Abs: 1.2 10*3/uL (ref 0.7–4.0)
MCH: 32.1 pg (ref 26.0–34.0)
MCHC: 35.7 g/dL (ref 30.0–36.0)
MCV: 89.8 fL (ref 80.0–100.0)
Monocytes Absolute: 1.4 10*3/uL — ABNORMAL HIGH (ref 0.1–1.0)
Monocytes Relative: 10 %
Neutro Abs: 11.3 10*3/uL — ABNORMAL HIGH (ref 1.7–7.7)
Neutrophils Relative %: 80 %
Platelets: 471 10*3/uL — ABNORMAL HIGH (ref 150–400)
RBC: 5.11 MIL/uL (ref 4.22–5.81)
RDW: 11.5 % (ref 11.5–15.5)
WBC: 14 10*3/uL — ABNORMAL HIGH (ref 4.0–10.5)
nRBC: 0 % (ref 0.0–0.2)

## 2023-12-13 LAB — COMPREHENSIVE METABOLIC PANEL
ALT: 95 U/L — ABNORMAL HIGH (ref 0–44)
AST: 79 U/L — ABNORMAL HIGH (ref 15–41)
Albumin: 4.8 g/dL (ref 3.5–5.0)
Alkaline Phosphatase: 59 U/L (ref 38–126)
Anion gap: 20 — ABNORMAL HIGH (ref 5–15)
BUN: 15 mg/dL (ref 6–20)
CO2: 22 mmol/L (ref 22–32)
Calcium: 10 mg/dL (ref 8.9–10.3)
Chloride: 92 mmol/L — ABNORMAL LOW (ref 98–111)
Creatinine, Ser: 1.01 mg/dL (ref 0.61–1.24)
GFR, Estimated: >60 mL/min (ref >60)
Glucose, Bld: 108 mg/dL — ABNORMAL HIGH (ref 70–99)
Potassium: 3.1 mmol/L — ABNORMAL LOW (ref 3.5–5.1)
Sodium: 134 mmol/L — ABNORMAL LOW (ref 135–145)
Total Bilirubin: 1.9 mg/dL — ABNORMAL HIGH (ref 0.0–1.2)
Total Protein: 7.9 g/dL (ref 6.5–8.1)

## 2023-12-13 LAB — URINALYSIS, ROUTINE W REFLEX MICROSCOPIC
Bacteria, UA: NONE SEEN
Bilirubin Urine: NEGATIVE
Glucose, UA: NEGATIVE mg/dL
Hgb urine dipstick: NEGATIVE
Ketones, ur: 20 mg/dL — AB
Leukocytes,Ua: NEGATIVE
Nitrite: NEGATIVE
Protein, ur: NEGATIVE mg/dL
Specific Gravity, Urine: 1.017 (ref 1.005–1.030)
pH: 7 (ref 5.0–8.0)

## 2023-12-13 LAB — BLOOD GAS, VENOUS
Acid-Base Excess: 9.6 mmol/L — ABNORMAL HIGH (ref 0.0–2.0)
Bicarbonate: 30.1 mmol/L — ABNORMAL HIGH (ref 20.0–28.0)
O2 Saturation: 91.8 %
Patient temperature: 37
pCO2, Ven: 28 mmHg — ABNORMAL LOW (ref 44–60)
pH, Ven: 7.64 (ref 7.25–7.43)
pO2, Ven: 55 mmHg — ABNORMAL HIGH (ref 32–45)

## 2023-12-13 LAB — RAPID URINE DRUG SCREEN, HOSP PERFORMED
Amphetamines: NOT DETECTED
Barbiturates: NOT DETECTED
Benzodiazepines: POSITIVE — AB
Cocaine: NOT DETECTED
Opiates: NOT DETECTED
Tetrahydrocannabinol: POSITIVE — AB

## 2023-12-13 LAB — RESP PANEL BY RT-PCR (RSV, FLU A&B, COVID)  RVPGX2
Influenza A by PCR: NEGATIVE
Influenza B by PCR: NEGATIVE
Resp Syncytial Virus by PCR: NEGATIVE
SARS Coronavirus 2 by RT PCR: NEGATIVE

## 2023-12-13 LAB — LIPASE, BLOOD: Lipase: 35 U/L (ref 11–51)

## 2023-12-13 LAB — ETHANOL: Alcohol, Ethyl (B): <10 mg/dL (ref <10)

## 2023-12-13 LAB — MAGNESIUM: Magnesium: 1.6 mg/dL — ABNORMAL LOW (ref 1.7–2.4)

## 2023-12-13 LAB — I-STAT CG4 LACTIC ACID, ED: Lactic Acid, Venous: 1.4 mmol/L (ref 0.5–1.9)

## 2023-12-13 MED ORDER — LORAZEPAM 2 MG/ML IJ SOLN: 1.0000 mg | INTRAMUSCULAR | Status: DC | PRN

## 2023-12-13 MED ORDER — LORAZEPAM 1 MG PO TABS: 0.0000 mg | ORAL_TABLET | Freq: Two times a day (BID) | ORAL | Status: DC

## 2023-12-13 MED ORDER — ONDANSETRON HCL 4 MG/2ML IJ SOLN: 4.0000 mg | Freq: Four times a day (QID) | INTRAMUSCULAR | Status: DC | PRN

## 2023-12-13 MED ORDER — LORAZEPAM 2 MG/ML IJ SOLN
0.0000 mg | Freq: Four times a day (QID) | INTRAMUSCULAR | Status: DC
  Administered 2023-12-13: 1 mg via INTRAVENOUS
  Filled 2023-12-13: qty 1

## 2023-12-13 MED ORDER — THIAMINE MONONITRATE 100 MG PO TABS: 100.0000 mg | ORAL_TABLET | Freq: Every day | ORAL | Status: DC

## 2023-12-13 MED ORDER — ADULT MULTIVITAMIN W/MINERALS CH
1.0000 | ORAL_TABLET | Freq: Every day | ORAL | Status: DC
  Administered 2023-12-13: 1 via ORAL
  Filled 2023-12-13: qty 1

## 2023-12-13 MED ORDER — SODIUM CHLORIDE 0.9 % IV BOLUS
1000.0000 mL | Freq: Once | INTRAVENOUS | Status: AC
  Administered 2023-12-13: 1000 mL via INTRAVENOUS

## 2023-12-13 MED ORDER — ACETAMINOPHEN 650 MG RE SUPP: 650.0000 mg | Freq: Four times a day (QID) | RECTAL | Status: DC | PRN

## 2023-12-13 MED ORDER — LORAZEPAM 2 MG/ML IJ SOLN
1.0000 mg | Freq: Once | INTRAMUSCULAR | Status: DC
  Administered 2023-12-13: 1 mg via INTRAVENOUS
  Filled 2023-12-13: qty 1

## 2023-12-13 MED ORDER — POTASSIUM CHLORIDE CRYS ER 20 MEQ PO TBCR
40.0000 meq | EXTENDED_RELEASE_TABLET | Freq: Once | ORAL | Status: DC
Start: 2023-12-13 — End: 2023-12-14

## 2023-12-13 MED ORDER — DEXTROSE-SODIUM CHLORIDE 5-0.9 % IV SOLN: INTRAVENOUS | Status: AC

## 2023-12-13 MED ORDER — FOLIC ACID 1 MG PO TABS
1.0000 mg | ORAL_TABLET | Freq: Every day | ORAL | Status: DC
  Administered 2023-12-13: 1 mg via ORAL
  Filled 2023-12-13: qty 1

## 2023-12-13 MED ORDER — LORAZEPAM 1 MG PO TABS
0.0000 mg | ORAL_TABLET | Freq: Four times a day (QID) | ORAL | Status: DC
Start: 2023-12-13 — End: 2023-12-13

## 2023-12-13 MED ORDER — ONDANSETRON HCL 4 MG PO TABS
4.0000 mg | ORAL_TABLET | Freq: Four times a day (QID) | ORAL | Status: DC | PRN
Start: 2023-12-13 — End: 2023-12-14

## 2023-12-13 MED ORDER — PANTOPRAZOLE SODIUM 40 MG IV SOLR
40.0000 mg | Freq: Once | INTRAVENOUS | Status: AC
  Administered 2023-12-13: 40 mg via INTRAVENOUS
  Filled 2023-12-13: qty 10

## 2023-12-13 MED ORDER — THIAMINE HCL 100 MG/ML IJ SOLN
100.0000 mg | Freq: Once | INTRAMUSCULAR | Status: AC
  Administered 2023-12-13: 100 mg via INTRAVENOUS
  Filled 2023-12-13: qty 2

## 2023-12-13 MED ORDER — LORAZEPAM 1 MG PO TABS: 1.0000 mg | ORAL_TABLET | ORAL | Status: DC | PRN

## 2023-12-13 MED ORDER — LORAZEPAM 1 MG PO TABS: 0.0000 mg | ORAL_TABLET | Freq: Three times a day (TID) | ORAL | Status: DC

## 2023-12-13 MED ORDER — LORAZEPAM 2 MG/ML IJ SOLN: 0.0000 mg | Freq: Two times a day (BID) | INTRAMUSCULAR | Status: DC

## 2023-12-13 MED ORDER — LORAZEPAM 2 MG/ML IJ SOLN: 2.0000 mg | Freq: Once | INTRAMUSCULAR | Status: DC

## 2023-12-13 MED ORDER — MAGNESIUM SULFATE 2 GM/50ML IV SOLN
2.0000 g | Freq: Once | INTRAVENOUS | Status: AC
  Administered 2023-12-13: 2 g via INTRAVENOUS
  Filled 2023-12-13: qty 50

## 2023-12-13 MED ORDER — LORAZEPAM 2 MG/ML IJ SOLN
2.0000 mg | Freq: Once | INTRAMUSCULAR | Status: AC
  Administered 2023-12-13: 2 mg via INTRAVENOUS
  Filled 2023-12-13: qty 1

## 2023-12-13 MED ORDER — ACETAMINOPHEN 325 MG PO TABS: 650.0000 mg | ORAL_TABLET | Freq: Four times a day (QID) | ORAL | Status: DC | PRN

## 2023-12-13 MED ORDER — LORAZEPAM 1 MG PO TABS: 0.0000 mg | ORAL_TABLET | ORAL | Status: DC

## 2023-12-13 MED ORDER — POTASSIUM CHLORIDE 10 MEQ/100ML IV SOLN
10.0000 meq | INTRAVENOUS | Status: AC
  Administered 2023-12-13 (×2): 10 meq via INTRAVENOUS
  Filled 2023-12-13 (×3): qty 100

## 2023-12-13 MED ORDER — THIAMINE HCL 100 MG/ML IJ SOLN
100.0000 mg | Freq: Every day | INTRAMUSCULAR | Status: DC
  Filled 2023-12-13: qty 2

## 2023-12-13 NOTE — ED Triage Notes (Signed)
  Patient comes in with 2 day hx of N/V.  Patient states he has been unable to keep down ginger ale or saltines since Friday.  Has been taking zofran that helps intermittently but only for short timeframe.  Denies any abdominal pain.  Endorses marijuana use and 3-4 shots of liquor daily before this started happening.

## 2023-12-13 NOTE — ED Provider Notes (Signed)
 [place] EMERGENCY DEPARTMENT AT [facility] Provider Note   CSN: [id number] Arrival date & time: 12/13/23  0321     History  Chief Complaint  Patient presents with   Emesis    Jeffery Anderson is a 23 y.o. male who has been a very heavy drinker drinking nearly 1/5 of liquor daily for the last 2 years who presents today with intractable nausea vomiting, lightheadedness, fatigue, tremors, anxiety, and tactile disturbance in context of self-guided taper off of alcohol.  States that he is taking a few "swigs" out of his liquor bottle a day trying to cut back, hoping to achieve sobriety.  No known medical diagnoses, patient marijuana use.  Has Zofran at home but not resolving his nausea and vomiting.  Level of caveat due to acuity of presentation upon arrival, patient clinically appears in alcohol withdrawal.  CIWA of 13.   HPI     Home Medications Prior to Admission medications   Medication Sig Start Date End Date Taking? Authorizing Provider  albuterol (VENTOLIN HFA) 108 (90 Base) MCG/ACT inhaler Inhale 1-2 puffs into the lungs every 6 (six) hours as needed for wheezing or shortness of breath. 10/31/22  Yes Al Decant, PA-C  diphenhydrAMINE (BENADRYL) 25 MG tablet Take 25 mg by mouth every 6 (six) hours as needed (post nasal drip).   Yes [provider]  ondansetron (ZOFRAN) 4 MG tablet Take 1 tablet (4 mg total) by mouth every 8 (eight) hours as needed for up to 12 doses for nausea or vomiting. 09/15/23  Yes Trifan, Kermit Balo, MD      Allergies    Patient has no known allergies.    Review of Systems   Review of Systems  Constitutional:  Positive for activity change, appetite change, chills and fatigue.  HENT: Negative.    Respiratory: Negative.    Cardiovascular: Negative.   Gastrointestinal:  Positive for nausea and vomiting. Negative for diarrhea.  Genitourinary: Negative.   Neurological:  Positive for tremors, weakness, light-headedness and  headaches.    Physical Exam Updated Vital Signs BP 107/61   Pulse 79   Temp (!) 97.5 F (36.4 C) (Oral)   Resp 19   Ht 5\' 8"  (1.727 m)   Wt 59 kg   SpO2 95%   BMI 19.77 kg/m  Physical Exam Vitals and nursing note reviewed.  Constitutional:      Appearance: He is ill-appearing. He is not toxic-appearing.  HENT:     Head: Normocephalic and atraumatic.     Mouth/Throat:     Mouth: Mucous membranes are moist.     Pharynx: No oropharyngeal exudate or posterior oropharyngeal erythema.  Eyes:     General:        Right eye: No discharge.        Left eye: No discharge.     Conjunctiva/sclera: Conjunctivae normal.  Cardiovascular:     Rate and Rhythm: Regular rhythm. Tachycardia present.     Pulses: Normal pulses.     Heart sounds: Normal heart sounds. No murmur heard. Pulmonary:     Effort: Pulmonary effort is normal. No respiratory distress.     Breath sounds: Normal breath sounds. No wheezing or rales.  Abdominal:     General: Bowel sounds are normal. There is no distension.     Palpations: Abdomen is soft.     Tenderness: There is no abdominal tenderness. There is no guarding or rebound.     Comments: Vomiting throughout exam  Musculoskeletal:  General: No deformity.     Cervical back: Neck supple.     Right lower leg: No edema.     Left lower leg: No edema.  Skin:    General: Skin is warm and dry.     Capillary Refill: Capillary refill takes less than 2 seconds.  Neurological:     Mental Status: He is alert and oriented to person, place, and time. Mental status is at baseline.     Motor: Tremor present.     Comments: Bilateral upper extremity tremor at rest, visible.  Psychiatric:        Mood and Affect: Mood is anxious.     Comments: Endorsing mild anxiety and tactile disturbance, no AVH     ED Results / Procedures / Treatments   Labs (all labs ordered are listed, but only abnormal results are displayed) Labs Reviewed  CBC WITH DIFFERENTIAL/PLATELET -  Abnormal; Notable for the following components:      Result Value   WBC 14.0 (*)    Platelets 471 (*)    Neutro Abs 11.3 (*)    Monocytes Absolute 1.4 (*)    All other components within normal limits  COMPREHENSIVE METABOLIC PANEL - Abnormal; Notable for the following components:   Sodium 134 (*)    Potassium 3.1 (*)    Chloride 92 (*)    Glucose, Bld 108 (*)    AST 79 (*)    ALT 95 (*)    Total Bilirubin 1.9 (*)    Anion gap 20 (*)    All other components within normal limits  BLOOD GAS, VENOUS - Abnormal; Notable for the following components:   pH, Ven 7.64 (*)    pCO2, Ven 28 (*)    pO2, Ven 55 (*)    Bicarbonate 30.1 (*)    Acid-Base Excess 9.6 (*)    All other components within normal limits  MAGNESIUM - Abnormal; Notable for the following components:   Magnesium 1.6 (*)    All other components within normal limits  RESP PANEL BY RT-PCR (RSV, FLU A&B, COVID)  RVPGX2  LIPASE, BLOOD  ETHANOL  RAPID URINE DRUG SCREEN, HOSP PERFORMED  URINALYSIS, ROUTINE W REFLEX MICROSCOPIC  BLOOD GAS, VENOUS  I-STAT CG4 LACTIC ACID, ED  I-STAT CG4 LACTIC ACID, ED    EKG EKG Interpretation Date/Time:  Sunday December 13 2023 04:12:11 EST Ventricular Rate:  84 PR Interval:  109 QRS Duration:  84 QT Interval:  398 QTC Calculation: 471 R Axis:   77  Text Interpretation: Sinus rhythm Short PR interval LVH by voltage Anterior ST elevation, probably due to LVH Borderline prolonged QT interval Similar to  Jan 2024 tracing Confirmed by Alona Bene [phone]) on 12/13/2023 4:16:39 AM  Radiology No results found.  Procedures .Critical Care  Performed by: Paris Lore, PA-C Authorized by: Paris Lore, PA-C   Critical care provider statement:    Critical care time (minutes):  45   Critical care was time spent personally by me on the following activities:  Development of treatment plan with patient or surrogate, discussions with consultants, evaluation of patient's  response to treatment, examination of patient, obtaining history from patient or surrogate, ordering and performing treatments and interventions, ordering and review of laboratory studies, ordering and review of radiographic studies, pulse oximetry and re-evaluation of patient's condition     Medications Ordered in ED Medications  LORazepam (ATIVAN) injection 0-4 mg (has no administration in time range)    Or  LORazepam (ATIVAN)  tablet 0-4 mg (has no administration in time range)  LORazepam (ATIVAN) injection 0-4 mg (has no administration in time range)    Or  LORazepam (ATIVAN) tablet 0-4 mg (has no administration in time range)  thiamine (VITAMIN B1) tablet 100 mg (has no administration in time range)    Or  thiamine (VITAMIN B1) injection 100 mg (has no administration in time range)  potassium chloride 10 mEq in 100 mL IVPB (10 mEq Intravenous New Bag/Given 12/13/23 0608)  magnesium sulfate IVPB 2 g 50 mL (has no administration in time range)  sodium chloride 0.9 % bolus 1,000 mL (0 mLs Intravenous Stopped 12/13/23 0619)  pantoprazole (PROTONIX) injection 40 mg (40 mg Intravenous Given 12/13/23 0439)  thiamine (VITAMIN B1) injection 100 mg (100 mg Intravenous Given 12/13/23 0503)  LORazepam (ATIVAN) injection 2 mg (2 mg Intravenous Given 12/13/23 0503)  sodium chloride 0.9 % bolus 1,000 mL (1,000 mLs Intravenous New Bag/Given 12/13/23 0981)    ED Course/ Medical Decision Making/ A&P Clinical Course as of 12/13/23 0709  Sun Dec 13, 2023  0438 CIWA 13 [RS]  0605 Consult to Dr. Margo Aye, hospitalist, who is agreeable to admitting this patient to her service. I appreciate her collaboration in the care of this patient.  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  Tachycardic hypertensive anxious appearing on intake, ill-appearing.  Pulmonary unremarkable, tachycardia with regular rhythm, abdominal exam is benign, vomiting throughout exam.  Diaphoretic, tremulous, anxious appearing.  DDx includes limited to cannabinoid hyperemesis syndrome, EtOH withdrawal, EtOH ketoacidosis, sepsis.  Amount and/or Complexity of Data Reviewed Labs: ordered.    Details: CBC CMP with leukocytosis of 14, thrombocytosis of 471, CMP with mild hyponatremia 134, hypokalemia of 3.1, transaminitis with AST/ALT 79/95.  Elevated total bili 1.9, anion gap of 20.  EtOH negative.  Magnesium of 1.6, low.  Lipase of 35.  VBG without alkalosis of 7.6, lactic acid is normal.  RVP is negative.    Risk OTC drugs. Prescription drug management. Decision regarding hospitalization.   Clinical picture most consistent with acute alcohol withdrawal with metabolic alkalosis, symptoms and vital signs stabilized after 2 mg of IV Ativan, thiamine administered, Protonix administered, fluid resuscitated, potassium repletion IV.  Magnesium repletion ordered as well.  Patient will require admission to the hospital for further stabilization for acute alcohol withdrawal with metabolic derangement.  Consult to hospitalist as above. [employer]  voiced understanding of her medical evaluation and treatment plan. Each of their questions answered to their expressed satisfaction.  HE is amenable to plan for admission.   This chart was dictated using voice recognition software, Dragon. Despite the best efforts of this provider to proofread and correct errors, errors may still occur which can change documentation meaning.         Final Clinical Impression(s) / ED Diagnoses Final diagnoses:  Alcohol withdrawal syndrome with perceptual disturbance [facility])    Rx / DC Orders ED Discharge Orders     None         Sherrilee Gilles 12/13/23 1914    Maia Plan, MD 12/16/23  [phone]

## 2023-12-13 NOTE — H&P (Signed)
 History and Physical    Patient: Jeffery Anderson XBJ:[id number] DOB: 21-Jul-2001 DOA: 12/13/2023 DOS: the patient was seen and examined on 12/13/2023 PCP: Pcp, No  Patient coming from: Home  Chief Complaint:  Chief Complaint  Patient presents with   Emesis   HPI: Jeffery Anderson is a 23 y.o. male with medical history significant of asthma and alcohol abuse who presented to the emergency department complaints of abdominal pain, multiple episodes of emesis and tremors.  He has being unable to drink or eat without vomiting.  He drinks 3-4 shots of liquor daily and also smokes cannabis.  He stated he is not coping with anything specifically, but started doing these things with friends and out of boredom.   No diarrhea, constipation, melena or hematochezia.He denied fever, chills, rhinorrhea, sore throat, wheezing or hemoptysis.  No chest pain, palpitations, diaphoresis, PND, orthopnea or pitting edema of the lower extremities.  No flank pain, dysuria, frequency or hematuria.  No polyuria, polydipsia, polyphagia or blurred vision.   Lab work: Urinalysis showed ketones of 20 mg/dL, but was otherwise unremarkable.  UDS was positive for cannabis and benzodiazepines, but was obtained after the patient had received lorazepam.  Normal lipase, alcohol and lactic acid level.  Magnesium was 1.6 mg/dL.  Venous blood gas showed a pH of 7.64, pCO2 of 28 and pO2 of 55 mmHg.  Bicarbonate was 30.1 and acid-base deficit is 9.6 mmol/L.  CMP showed a sodium 134, potassium 3.1, chloride 92 and CO2 22 mmol/L.  Glucose 108 and total bilirubin 1.9 mg/dL.  AST was 79 and ALT 95 units/L.  Calcium, renal function, alkaline phosphatase and protein levels were normal.  ED course: Initial vital signs were temperature 97.5 F, pulse 111, respiration 18, BP 150/98 mmHg O2 sat 96% on room air.  He received lorazepam per CIWA protocol, magnesium sulfate 2 g IVPB, Protonix 40 mg IVP, thiamine 100 mg IVP and 2000 mL of normal saline.    Review of Systems: As mentioned in the history of present illness. All other systems reviewed and are negative. Past Medical History:  Diagnosis Date   Asthma    Past Surgical History:  Procedure Laterality Date   ELBOW SURGERY Left    Social History:  reports that he has never smoked. He has never been exposed to tobacco smoke. He has never used smokeless tobacco. He reports current alcohol use. He reports current drug use. Drug: Marijuana.  No Known Allergies  Family History  Problem Relation Age of Onset   Diabetes Father    COPD Father     Prior to Admission medications   Medication Sig Start Date End Date Taking? Authorizing Provider  albuterol (VENTOLIN HFA) 108 (90 Base) MCG/ACT inhaler Inhale 1-2 puffs into the lungs every 6 (six) hours as needed for wheezing or shortness of breath. 10/31/22  Yes Al Decant, PA-C  diphenhydrAMINE (BENADRYL) 25 MG tablet Take 25 mg by mouth every 6 (six) hours as needed (post nasal drip).   Yes [provider]  ondansetron (ZOFRAN) 4 MG tablet Take 1 tablet (4 mg total) by mouth every 8 (eight) hours as needed for up to 12 doses for nausea or vomiting. 09/15/23  Yes Terald Sleeper, MD    Physical Exam: Vitals:   12/13/23 0328 12/13/23 0335 12/13/23 0530 12/13/23 0630  BP: (!) 150/98  131/77 107/61  Pulse: (!) 111  74 79  Resp: 18  19 19   Temp: (!) 97.5 F (36.4 C)  TempSrc: Oral     SpO2: 96%  97% 95%  Weight:  59 kg    Height:  5\' 8"  (1.727 m)     Physical Exam Vitals and nursing note reviewed.  Constitutional:      General: He is awake. He is not in acute distress.    Appearance: Normal appearance. He is ill-appearing.  HENT:     Head: Normocephalic.     Nose: No rhinorrhea.     Mouth/Throat:     Mouth: Mucous membranes are moist.  Eyes:     General: No scleral icterus.    Pupils: Pupils are equal, round, and reactive to light.  Neck:     Vascular: No JVD.  Cardiovascular:     Rate and Rhythm:  Normal rate and regular rhythm.     Heart sounds: S1 normal and S2 normal.  Pulmonary:     Effort: Pulmonary effort is normal.     Breath sounds: Normal breath sounds. No wheezing, rhonchi or rales.  Abdominal:     General: Bowel sounds are normal. There is no distension.     Palpations: Abdomen is soft.     Tenderness: There is no abdominal tenderness. There is no guarding.  Musculoskeletal:     Cervical back: Neck supple.     Right lower leg: No edema.     Left lower leg: No edema.  Skin:    General: Skin is warm and dry.  Neurological:     General: No focal deficit present.     Mental Status: He is alert and oriented to person, place, and time.     Cranial Nerves: No cranial nerve deficit.     Motor: Tremor present.  Psychiatric:        Mood and Affect: Mood normal.        Behavior: Behavior normal. Behavior is cooperative.     Data Reviewed:  Results are pending, will review when available.  Assessment and Plan: Principal Problem:   Alcohol abuse Presenting with:   Alcohol withdrawal (HCC)   Transaminitis   Hyperbilirubinemia Admit to PCU/inpatient. CIWA protocol with lorazepam. Magnesium sulfate supplementation. Folate, MVI and thiamine. Consult TOC team. Alcohol cessation advised. The patient stated he may signed AMA.  Active Problems:   Mixed acid-base balance disorder In the setting of alcohol/nausea/vomiting.   Clinically improved after IV fluids.    Hypokalemia Secondary to GI losses. Supplement given. Follow-up potassium level in AM.    Hyponatremia Secondary to GI losses. Follow sodium level in AM.    Hypomagnesemia Magnesium sulfate as above.    Thrombocytosis Monitor platelet count.    Advance Care Planning:   Code Status: Full Code   Consults:   Family Communication:   Severity of Illness: The appropriate patient status for this patient is INPATIENT. Inpatient status is judged to be reasonable and necessary in order to provide  the required intensity of service to ensure the patient's safety. The patient's presenting symptoms, physical exam findings, and initial radiographic and laboratory data in the context of their chronic comorbidities is felt to place them at high risk for further clinical deterioration. Furthermore, it is not anticipated that the patient will be medically stable for discharge from the hospital within 2 midnights of admission.   * I certify that at the point of admission it is my clinical judgment that the patient will require inpatient hospital care spanning beyond 2 midnights from the point of admission due to high intensity of service, high risk  for further deterioration and high frequency of surveillance required.*  Author: Bobette Mo, MD 12/13/2023 7:17 AM  For on call review [email or web address].   This document was prepared using Dragon voice recognition software and may contain some unintended transcription errors.

## 2023-12-13 NOTE — Progress Notes (Signed)
 Patient verbalized wanting to leave AMA during shift change.  Patient educated on leaving AMA. Patient signed AMA paper. IV taken out. Tele off.  Patient walked out at [employer]

## 2023-12-14 NOTE — Discharge Summary (Signed)
 Physician Discharge Summary   Patient: Jeffery Anderson MRN: [id number] DOB: 2001-10-15  Admit date:     12/13/2023  Discharge date: 12/13/2023  Discharge Physician: Bobette Mo   PCP: Pcp, No   Recommendations at discharge:   Left AMA.  Discharge Diagnoses: Principal Problem:   Alcohol withdrawal (HCC) Active Problems:   Hypokalemia   Hyponatremia   Hypomagnesemia   Alcohol abuse   Thrombocytosis   Transaminitis   Hyperbilirubinemia   Mixed acid-base balance disorder   Hospital Course: Chief Complaint  Patient presents with   Emesis    HPI: Jeffery Anderson is a 23 y.o. male with medical history significant of asthma and alcohol abuse who presented to the emergency department complaints of abdominal pain, multiple episodes of emesis and tremors.  He has being unable to drink or eat without vomiting.  He drinks 3-4 shots of liquor daily and also smokes cannabis.  He stated he is not coping with anything specifically, but started doing these things with friends and out of boredom.   No diarrhea, constipation, melena or hematochezia.He denied fever, chills, rhinorrhea, sore throat, wheezing or hemoptysis.  No chest pain, palpitations, diaphoresis, PND, orthopnea or pitting edema of the lower extremities.  No flank pain, dysuria, frequency or hematuria.  No polyuria, polydipsia, polyphagia or blurred vision.    Lab work: Urinalysis showed ketones of 20 mg/dL, but was otherwise unremarkable.  UDS was positive for cannabis and benzodiazepines, but was obtained after the patient had received lorazepam.  Normal lipase, alcohol and lactic acid level.  Magnesium was 1.6 mg/dL.  Venous blood gas showed a pH of 7.64, pCO2 of 28 and pO2 of 55 mmHg.  Bicarbonate was 30.1 and acid-base deficit is 9.6 mmol/L.  CMP showed a sodium 134, potassium 3.1, chloride 92 and CO2 22 mmol/L.  Glucose 108 and total bilirubin 1.9 mg/dL.  AST was 79 and ALT 95 units/L.  Calcium, renal function, alkaline  phosphatase and protein levels were normal.   ED course: Initial vital signs were temperature 97.5 F, pulse 111, respiration 18, BP 150/98 mmHg O2 sat 96% on room air.  He received lorazepam per CIWA protocol, magnesium sulfate 2 g IVPB, Protonix 40 mg IVP, thiamine 100 mg IVP and 2000 mL of normal saline.  Assessment and Plan: Principal Problem:   Alcohol abuse Presenting with:   Alcohol withdrawal (HCC)   Transaminitis   Hyperbilirubinemia Admit to PCU/inpatient. CIWA protocol with lorazepam. Magnesium sulfate supplementation. Folate, MVI and thiamine. Consult TOC team. Alcohol cessation advised. The patient stated he may signed AMA.   Active Problems:   Mixed acid-base balance disorder In the setting of alcohol/nausea/vomiting.   Clinically improved after IV fluids.     Hypokalemia Secondary to GI losses. Supplement given. Follow-up potassium level in AM.     Hyponatremia Secondary to GI losses. Follow sodium level in AM.     Hypomagnesemia Magnesium sulfate as above.     Thrombocytosis Monitor platelet count.   Consultants: None. Procedures performed: None.  Disposition: Home Diet recommendation:  Regular diet DISCHARGE MEDICATION: Allergies as of 12/13/2023   No Known Allergies      Medication List     ASK your doctor about these medications    albuterol 108 (90 Base) MCG/ACT inhaler Commonly known as: VENTOLIN HFA Inhale 1-2 puffs into the lungs every 6 (six) hours as needed for wheezing or shortness of breath.   diphenhydrAMINE 25 MG tablet Commonly known as: BENADRYL Take 25 mg by mouth  every 6 (six) hours as needed (post nasal drip).   ondansetron 4 MG tablet Commonly known as: ZOFRAN Take 1 tablet (4 mg total) by mouth every 8 (eight) hours as needed for up to 12 doses for nausea or vomiting.       Discharge Exam: Filed Weights   12/13/23 0335  Weight: 59 kg   Did not wait for next re-evaluation.  Earlier exam as below: Physical  Exam:       Vitals:    12/13/23 0328 12/13/23 0335 12/13/23 0530 12/13/23 0630  BP: (!) 150/98   131/77 107/61  Pulse: (!) 111   74 79  Resp: 18   19 19   Temp: (!) 97.5 F (36.4 C)        TempSrc: Oral        SpO2: 96%   97% 95%  Weight:   59 kg      Height:   5\' 8"  (1.727 m)        Physical Exam Vitals and nursing note reviewed.  Constitutional:      General: He is awake. He is not in acute distress.    Appearance: Normal appearance. He is ill-appearing.  HENT:     Head: Normocephalic.     Nose: No rhinorrhea.     Mouth/Throat:     Mouth: Mucous membranes are moist.  Eyes:     General: No scleral icterus.    Pupils: Pupils are equal, round, and reactive to light.  Neck:     Vascular: No JVD.  Cardiovascular:     Rate and Rhythm: Normal rate and regular rhythm.     Heart sounds: S1 normal and S2 normal.  Pulmonary:     Effort: Pulmonary effort is normal.     Breath sounds: Normal breath sounds. No wheezing, rhonchi or rales.  Abdominal:     General: Bowel sounds are normal. There is no distension.     Palpations: Abdomen is soft.     Tenderness: There is no abdominal tenderness. There is no guarding.  Musculoskeletal:     Cervical back: Neck supple.     Right lower leg: No edema.     Left lower leg: No edema.  Skin:    General: Skin is warm and dry.  Neurological:     General: No focal deficit present.     Mental Status: He is alert and oriented to person, place, and time.     Cranial Nerves: No cranial nerve deficit.     Motor: Tremor present.  Psychiatric:        Mood and Affect: Mood normal.        Behavior: Behavior normal. Behavior is cooperative.   Condition at discharge: fair  The results of significant diagnostics from this hospitalization (including imaging, microbiology, ancillary and laboratory) are listed below for reference.   Imaging Studies: No results found.  Microbiology: Results for orders placed or performed during the hospital  encounter of 12/13/23  Resp panel by RT-PCR (RSV, Flu A&B, Covid) Anterior Nasal Swab     Status: None   Collection Time: 12/13/23  3:36 AM   Specimen: Anterior Nasal Swab  Result Value Ref Range Status   SARS Coronavirus 2 by RT PCR NEGATIVE NEGATIVE Final    Comment: (NOTE) SARS-CoV-2 target nucleic acids are NOT DETECTED.  The SARS-CoV-2 RNA is generally detectable in upper respiratory specimens during the acute phase of infection. The lowest concentration of SARS-CoV-2 viral copies this assay can detect is 138  copies/mL. A negative result does not preclude SARS-Cov-2 infection and should not be used as the sole basis for treatment or other patient management decisions. A negative result may occur with  improper specimen collection/handling, submission of specimen other than nasopharyngeal swab, presence of viral mutation(s) within the areas targeted by this assay, and inadequate number of viral copies(<138 copies/mL). A negative result must be combined with clinical observations, patient history, and epidemiological information. The expected result is Negative.  Fact Sheet for Patients:  BloggerCourse.com  Fact Sheet for Healthcare Providers:  SeriousBroker.it  This test is no t yet approved or cleared by the [place] FDA and  has been authorized for detection and/or diagnosis of SARS-CoV-2 by FDA under an Emergency Use Authorization (EUA). This EUA will remain  in effect (meaning this test can be used) for the duration of the COVID-19 declaration under Section 564(b)(1) of the Act, 21 U.S.C.section 360bbb-3(b)(1), unless the authorization is terminated  or revoked sooner.       Influenza A by PCR NEGATIVE NEGATIVE Final   Influenza B by PCR NEGATIVE NEGATIVE Final    Comment: (NOTE) The Xpert Xpress SARS-CoV-2/FLU/RSV plus assay is intended as an aid in the diagnosis of influenza from Nasopharyngeal swab specimens  and should not be used as a sole basis for treatment. Nasal washings and aspirates are unacceptable for Xpert Xpress SARS-CoV-2/FLU/RSV testing.  Fact Sheet for Patients: BloggerCourse.com  Fact Sheet for Healthcare Providers: SeriousBroker.it  This test is not yet approved or cleared by the [place] FDA and has been authorized for detection and/or diagnosis of SARS-CoV-2 by FDA under an Emergency Use Authorization (EUA). This EUA will remain in effect (meaning this test can be used) for the duration of the COVID-19 declaration under Section 564(b)(1) of the Act, 21 U.S.C. section 360bbb-3(b)(1), unless the authorization is terminated or revoked.     Resp Syncytial Virus by PCR NEGATIVE NEGATIVE Final    Comment: (NOTE) Fact Sheet for Patients: BloggerCourse.com  Fact Sheet for Healthcare Providers: SeriousBroker.it  This test is not yet approved or cleared by the [place] FDA and has been authorized for detection and/or diagnosis of SARS-CoV-2 by FDA under an Emergency Use Authorization (EUA). This EUA will remain in effect (meaning this test can be used) for the duration of the COVID-19 declaration under Section 564(b)(1) of the Act, 21 U.S.C. section 360bbb-3(b)(1), unless the authorization is terminated or revoked.  Performed at [facility], [address] [place], [place] [address]     Labs: CBC: Recent Labs  Lab 12/13/23 0400  WBC 14.0*  NEUTROABS 11.3*  HGB 16.4  HCT 45.9  MCV 89.8  PLT 471*   Basic Metabolic Panel: Recent Labs  Lab 12/13/23 0400  NA 134*  K 3.1*  CL 92*  CO2 22  GLUCOSE 108*  BUN 15  CREATININE 1.01  CALCIUM 10.0  MG 1.6*   Liver Function Tests: Recent Labs  Lab 12/13/23 0400  AST 79*  ALT 95*  ALKPHOS 59  BILITOT 1.9*  PROT 7.9  ALBUMIN 4.8   CBG: No results for input(s): "GLUCAP" in  the last 168 hours.  Discharge time spent: less than 30 minutes.  Signed: Bobette Mo, MD [facility] 12/14/2023  This document was prepared using Dragon voice recognition software and may contain some unintended transcription errors.

## 2024-01-17 IMAGING — DX DG CHEST 2V
2 series · 2 of 2 positions shown · non-contrast
Comparison: None.

CLINICAL DATA: Shortness of breath

EXAM:
CHEST - 2 VIEW

[chest pa]
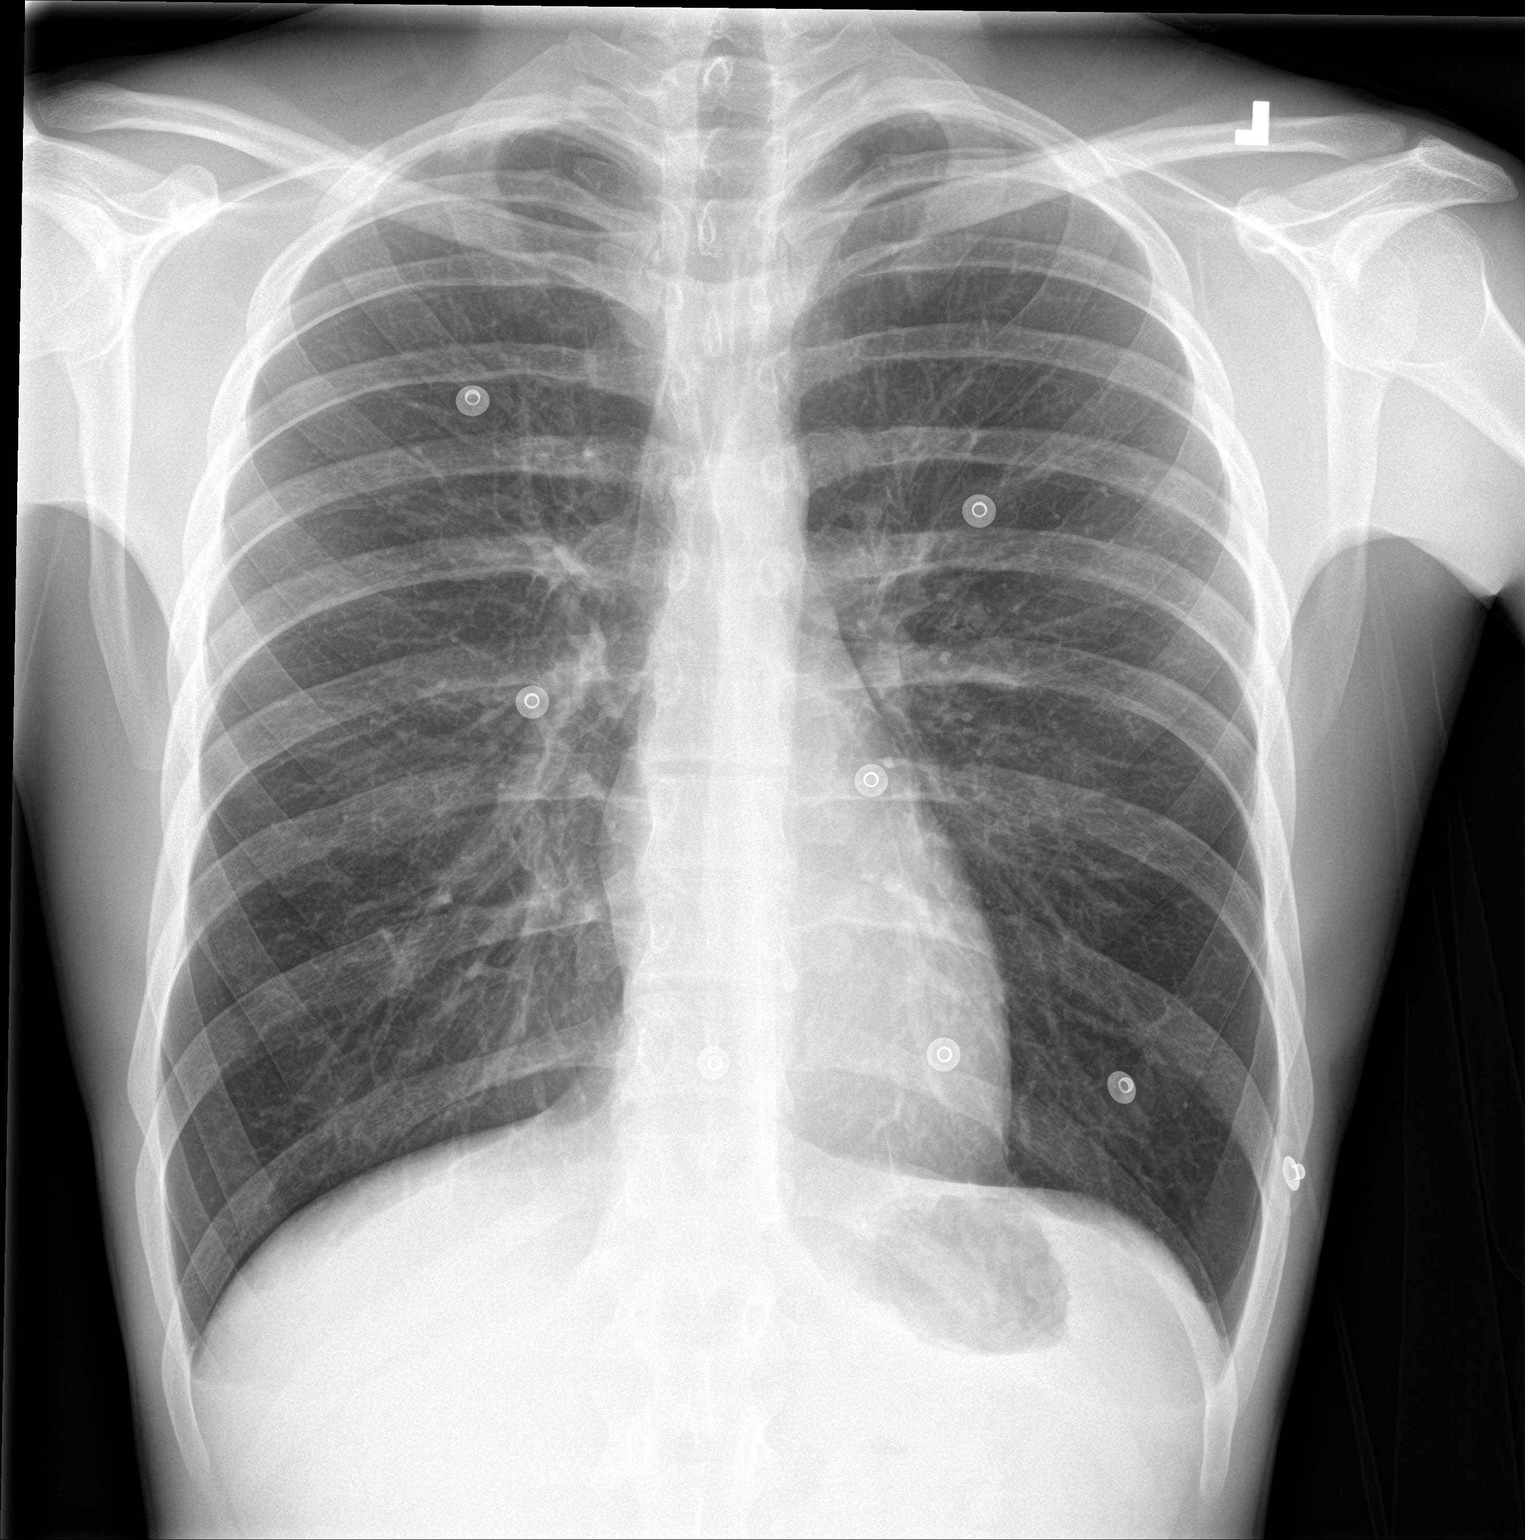

[chest lat]
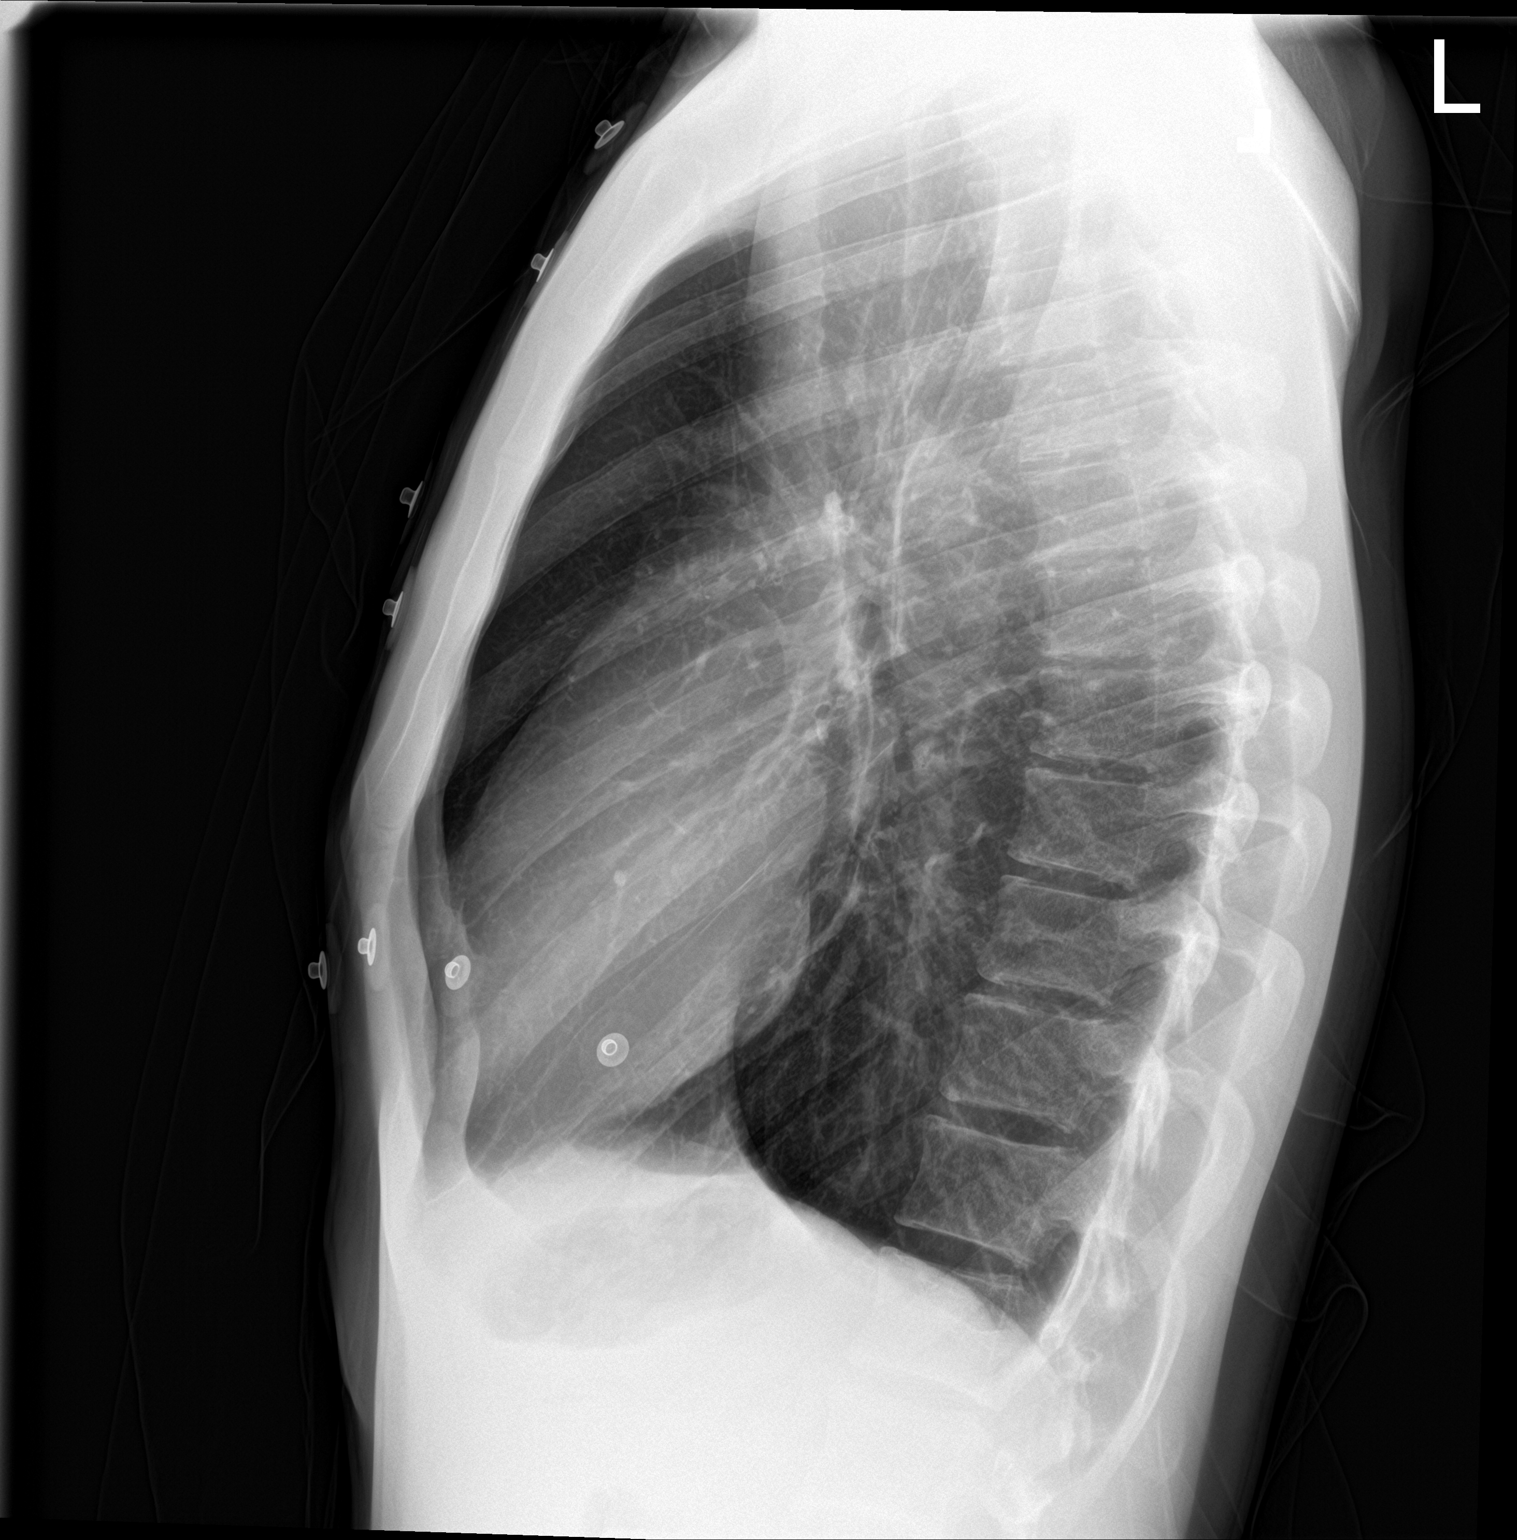

[2 of 2 positions shown; findings below may reference images not displayed]

FINDINGS: The heart size and mediastinal contours are within normal limits.
Both lungs are clear. No pleural effusion or pneumothorax. The
visualized skeletal structures are unremarkable.
IMPRESSION: No acute process in the chest.

## 2024-06-05 ENCOUNTER — Other Ambulatory Visit: Payer: Self-pay

## 2024-06-05 ENCOUNTER — Emergency Department (HOSPITAL_BASED_OUTPATIENT_CLINIC_OR_DEPARTMENT_OTHER)
Admission: EM | Admit: 2024-06-05 | Discharge: 2024-06-05 | Disposition: A | Discharge status: Home / Self Care | Attending: Emergency Medicine | Admitting: Emergency Medicine

## 2024-06-05 DIAGNOSIS — E876 Hypokalemia: Secondary | ICD-10-CM | POA: Diagnosis not present

## 2024-06-05 DIAGNOSIS — K2950 Unspecified chronic gastritis without bleeding: Secondary | ICD-10-CM | POA: Diagnosis not present

## 2024-06-05 DIAGNOSIS — R112 Nausea with vomiting, unspecified: Secondary | ICD-10-CM | POA: Diagnosis present

## 2024-06-05 DIAGNOSIS — J45909 Unspecified asthma, uncomplicated: Secondary | ICD-10-CM | POA: Diagnosis not present

## 2024-06-05 DIAGNOSIS — Z7951 Long term (current) use of inhaled steroids: Secondary | ICD-10-CM | POA: Insufficient documentation

## 2024-06-05 DIAGNOSIS — Z79899 Other long term (current) drug therapy: Secondary | ICD-10-CM | POA: Diagnosis not present

## 2024-06-05 LAB — COMPREHENSIVE METABOLIC PANEL WITH GFR
ALT: 20 U/L (ref 0–44)
AST: 22 U/L (ref 15–41)
Albumin: 5.1 g/dL — ABNORMAL HIGH (ref 3.5–5.0)
Alkaline Phosphatase: 64 U/L (ref 38–126)
Anion gap: 18 — ABNORMAL HIGH (ref 5–15)
BUN: 7 mg/dL (ref 6–20)
CO2: 17 mmol/L — ABNORMAL LOW (ref 22–32)
Calcium: 10.4 mg/dL — ABNORMAL HIGH (ref 8.9–10.3)
Chloride: 102 mmol/L (ref 98–111)
Creatinine, Ser: 0.89 mg/dL (ref 0.61–1.24)
GFR, Estimated: >60 mL/min (ref >60)
Glucose, Bld: 115 mg/dL — ABNORMAL HIGH (ref 70–99)
Potassium: 3.1 mmol/L — ABNORMAL LOW (ref 3.5–5.1)
Sodium: 138 mmol/L (ref 135–145)
Total Bilirubin: 1.2 mg/dL (ref 0.0–1.2)
Total Protein: 7.5 g/dL (ref 6.5–8.1)

## 2024-06-05 LAB — CBC
HCT: 44.7 % (ref 39.0–52.0)
Hemoglobin: 16 g/dL (ref 13.0–17.0)
MCH: 31.4 pg (ref 26.0–34.0)
MCHC: 35.8 g/dL (ref 30.0–36.0)
MCV: 87.8 fL (ref 80.0–100.0)
Platelets: 318 K/uL (ref 150–400)
RBC: 5.09 MIL/uL (ref 4.22–5.81)
RDW: 11.7 % (ref 11.5–15.5)
WBC: 9.4 K/uL (ref 4.0–10.5)
nRBC: 0 % (ref 0.0–0.2)

## 2024-06-05 LAB — URINALYSIS, ROUTINE W REFLEX MICROSCOPIC
Bilirubin Urine: NEGATIVE
Glucose, UA: NEGATIVE mg/dL
Hgb urine dipstick: NEGATIVE
Ketones, ur: >80 mg/dL — AB
Leukocytes,Ua: NEGATIVE
Nitrite: NEGATIVE
Protein, ur: 30 mg/dL — AB
Specific Gravity, Urine: 1.035 — ABNORMAL HIGH (ref 1.005–1.030)
pH: 6.5 (ref 5.0–8.0)

## 2024-06-05 LAB — I-STAT VENOUS BLOOD GAS, ED
Acid-base deficit: 2 mmol/L (ref 0.0–2.0)
Bicarbonate: 21 mmol/L (ref 20.0–28.0)
Calcium, Ion: 1.27 mmol/L (ref 1.15–1.40)
HCT: 45 % (ref 39.0–52.0)
Hemoglobin: 15.3 g/dL (ref 13.0–17.0)
O2 Saturation: 93 %
Patient temperature: 97.8
Potassium: 3.3 mmol/L — ABNORMAL LOW (ref 3.5–5.1)
Sodium: 137 mmol/L (ref 135–145)
TCO2: 22 mmol/L (ref 22–32)
pCO2, Ven: 30.7 mmHg — ABNORMAL LOW (ref 44–60)
pH, Ven: 7.442 — ABNORMAL HIGH (ref 7.25–7.43)
pO2, Ven: 62 mmHg — ABNORMAL HIGH (ref 32–45)

## 2024-06-05 LAB — ETHANOL: Alcohol, Ethyl (B): <15 mg/dL (ref <15)

## 2024-06-05 LAB — MAGNESIUM: Magnesium: 1.8 mg/dL (ref 1.7–2.4)

## 2024-06-05 LAB — LIPASE, BLOOD: Lipase: 36 U/L (ref 11–51)

## 2024-06-05 MED ORDER — FAMOTIDINE IN NACL 20-0.9 MG/50ML-% IV SOLN
20.0000 mg | Freq: Once | INTRAVENOUS | Status: AC
  Administered 2024-06-05: 20 mg via INTRAVENOUS
  Filled 2024-06-05: qty 50

## 2024-06-05 MED ORDER — DIAZEPAM 5 MG/ML IJ SOLN
5.0000 mg | Freq: Once | INTRAMUSCULAR | Status: AC
  Administered 2024-06-05: 5 mg via INTRAVENOUS
  Filled 2024-06-05: qty 2

## 2024-06-05 MED ORDER — OMEPRAZOLE 20 MG PO CPDR: 20.0000 mg | DELAYED_RELEASE_CAPSULE | Freq: Every day | ORAL | 0 refills | Status: DC

## 2024-06-05 MED ORDER — POTASSIUM CHLORIDE CRYS ER 20 MEQ PO TBCR
40.0000 meq | EXTENDED_RELEASE_TABLET | Freq: Once | ORAL | Status: AC
  Administered 2024-06-05: 40 meq via ORAL
  Filled 2024-06-05: qty 2

## 2024-06-05 MED ORDER — LACTATED RINGERS IV BOLUS
1000.0000 mL | Freq: Once | INTRAVENOUS | Status: AC
  Administered 2024-06-05: 1000 mL via INTRAVENOUS

## 2024-06-05 MED ORDER — ONDANSETRON HCL 4 MG/2ML IJ SOLN
4.0000 mg | Freq: Once | INTRAMUSCULAR | Status: AC
  Administered 2024-06-05: 4 mg via INTRAVENOUS
  Filled 2024-06-05: qty 2

## 2024-06-05 MED ORDER — ONDANSETRON 4 MG PO TBDP: 4.0000 mg | ORAL_TABLET | Freq: Three times a day (TID) | ORAL | 0 refills | Status: DC | PRN

## 2024-06-05 NOTE — ED Provider Notes (Signed)
 [place] EMERGENCY DEPARTMENT AT [facility] Provider Note   CSN: [id number] Arrival date & time: 06/05/24  1229     Patient presents with: Abdominal Pain   Jeffery Anderson is a 23 y.o. male with history of asthma presents emerged from today for evaluation of nausea and vomiting.  Patient reports he has had about 5 since yesterday.  Started dehydrated.  Did have some Ramen does not had some mild epigastric pain only after vomiting, lasted only few minutes, it was more of an ache than pain patient said.  Currently does not have any abdominal pain.  He had some Zofran  from a neighbor and took that.  Has not vomited but still having nausea.  He denies any fever.  Was having some looser stools around 4-5 episodes that were green in coloration but not black or bloody.  Still passing gas.  No dysuria, hematuria, testicular/scrotal/penile pain or swelling, any recent cough or cold symptoms.  No recent travel.  Denies any abdominal surgeries.  No known drug allergies.  Does consume 3 shots of liquor per day with last drink yesterday at 1600.  Daily marijuana use.  Tobacco use daily.  Confirmed with the patient that his only occasionally had abdominal pain and his main concern was N/V and unable to tolerate PO.      Abdominal Pain      Prior to Admission medications   Medication Sig Start Date End Date Taking? Authorizing Provider  omeprazole  (PRILOSEC) 20 MG capsule Take 1 capsule (20 mg total) by mouth daily. 06/05/24  Yes Bernis Ernst, PA-C  ondansetron  (ZOFRAN -ODT) 4 MG disintegrating tablet Take 1 tablet (4 mg total) by mouth every 8 (eight) hours as needed for nausea or vomiting. 06/05/24  Yes Bernis Ernst, PA-C  albuterol  (VENTOLIN  HFA) 108 (90 Base) MCG/ACT inhaler Inhale 1-2 puffs into the lungs every 6 (six) hours as needed for wheezing or shortness of breath. 10/31/22   Ruthell Lonni FALCON, PA-C  diphenhydrAMINE (BENADRYL) 25 MG tablet Take 25 mg by mouth every 6 (six) hours  as needed (post nasal drip).    [provider]    Allergies: Patient has no known allergies.    Review of Systems  Gastrointestinal:  Negative for abdominal pain.    Updated Vital Signs BP (!) 164/99   Pulse 75   Temp 97.8 F (36.6 C)   Resp 16   Ht 5' 8 (1.727 m)   Wt 72.6 kg   SpO2 100%   BMI 24.33 kg/m   Physical Exam Vitals and nursing note reviewed.  Constitutional:      General: He is not in acute distress.    Appearance: He is not ill-appearing or toxic-appearing.  HENT:     Mouth/Throat:     Comments: Dry mucous membranes Eyes:     General: No scleral icterus. Cardiovascular:     Rate and Rhythm: Normal rate.  Pulmonary:     Effort: Pulmonary effort is normal. No respiratory distress.  Abdominal:     General: There is no distension.     Palpations: Abdomen is soft.     Tenderness: There is no abdominal tenderness. There is no guarding or rebound.     Comments: No abdominal tenderness palpation.  Soft.  No guarding or rebound.  Skin:    General: Skin is warm and dry.  Neurological:     Mental Status: He is alert.     (all labs ordered are listed, but only abnormal results are displayed)  Labs Reviewed  COMPREHENSIVE METABOLIC PANEL WITH GFR - Abnormal; Notable for the following components:      Result Value   Potassium 3.1 (*)    CO2 17 (*)    Glucose, Bld 115 (*)    Calcium 10.4 (*)    Albumin 5.1 (*)    Anion gap 18 (*)    All other components within normal limits  URINALYSIS, ROUTINE W REFLEX MICROSCOPIC - Abnormal; Notable for the following components:   Specific Gravity, Urine 1.035 (*)    Ketones, ur >80 (*)    Protein, ur 30 (*)    Bacteria, UA RARE (*)    All other components within normal limits  I-STAT VENOUS BLOOD GAS, ED - Abnormal; Notable for the following components:   pH, Ven 7.442 (*)    pCO2, Ven 30.7 (*)    pO2, Ven 62 (*)    Potassium 3.3 (*)    All other components within normal limits  LIPASE, BLOOD  CBC   ETHANOL  MAGNESIUM     EKG: EKG Interpretation Date/Time:  Sunday June 05 2024 16:13:07 EDT Ventricular Rate:  65 PR Interval:  116 QRS Duration:  88 QT Interval:  477 QTC Calculation: 496 R Axis:   82  Text Interpretation: Sinus arrhythmia Borderline short PR interval LVH by voltage Confirmed by Ruthe Cornet [phone]) on 06/05/2024 4:23:35 PM  Radiology: No results found.  Procedures   Medications Ordered in the ED  lactated ringers  bolus 1,000 mL ( Intravenous Stopped 06/05/24 1609)  potassium chloride  SA (KLOR-CON  M) CR tablet 40 mEq (40 mEq Oral Given 06/05/24 1526)  famotidine  (PEPCID ) IVPB 20 mg premix (0 mg Intravenous Stopped 06/05/24 1556)  ondansetron  (ZOFRAN ) injection 4 mg (4 mg Intravenous Given 06/05/24 1526)  lactated ringers  bolus 1,000 mL (1,000 mLs Intravenous New Bag/Given 06/05/24 1657)  diazepam  (VALIUM ) injection 5 mg (5 mg Intravenous Given 06/05/24 1630)    Medical Decision Making Amount and/or Complexity of Data Reviewed Labs: ordered.  Risk Prescription drug management.   23 y.o. male presents to the ER for evaluation of nausea and vomiting. Differential diagnosis includes but is not limited to ACS/MI, Boerhaave's, DKA, elevated ICP, Ischemic bowel, Sepsis, drug-related, Appendicitis, Bowel obstruction, Electrolyte abnormalities, Pancreatitis, Biliary colic, Gastroenteritis, Gastroparesis, Hepatitis, Migraine, Thyroid disease, Renal colic, PUD, UTI. Vital signs unremarkable. Physical exam as noted above.   I independently reviewed and interpreted the patient's labs.  CBC without leukocytosis or anemia.  CMP shows a potassium of 3.1, bicarb of 17 and glucose of 115.  Mildly elevated calcium and albumin, likely hemoconcentration.  Anion gap of 18.  No other LFT abnormality.  Urinalysis shows concentrated urine with greater than 80 ketones present.  30 protein with rare bacteria but no signs of infection.  I-STAT VBG shows 7.44.  Ethanol  undetectable.  Patient does not have any abdominal tense palpation.  Do not think any additional imaging is needed at this time.  He was given IV Zofran , Pepcid , and fluids.  Before medication, patient reports he was already feeling better after the Zofran  tablet he had from a neighbor.  On reevaluation, he did have 2 more episodes of vomiting, did order him some Valium .  Reevaluation, patient passed p.o. challenge is feeling much better, and anticipating discharge home.  I see that he has been seen multiple times previously for nausea and vomiting.  Could be related to his alcohol or marijuana use.  Recommended tapering down on usage of both.  I did give him follow-up  for a GI provider.  He was given potassium, Zofran , fluids, Pepcid , and some Valium  while here.  Will discharge home with strict return precautions.  We discussed the results of the labs/imaging. The plan is supportive care, bland diet, tapering use of alcohol and marijuana, follow-up with GI. We discussed strict return precautions and red flag symptoms. The patient verbalized their understanding and agrees to the plan. The patient is stable and being discharged home in good condition.  Portions of this report may have been transcribed using voice recognition software. Every effort was made to ensure accuracy; however, inadvertent computerized transcription errors may be present.    Final diagnoses:  Nausea and vomiting, unspecified vomiting type  Chronic gastritis without bleeding, unspecified gastritis type  Hypokalemia    ED Discharge Orders          Ordered    ondansetron  (ZOFRAN -ODT) 4 MG disintegrating tablet  Every 8 hours PRN        06/05/24 1728    omeprazole  (PRILOSEC) 20 MG capsule  Daily        06/05/24 1728               Bernis Ernst, PA-C 06/05/24 1736    Ruthe Cornet, DO 06/05/24 1858

## 2024-06-05 NOTE — ED Triage Notes (Signed)
 Pt POV reporting upper abd and n/v, began last night, unable to keep anything down today. Hx alcohol abuse, drinks daily, last drink yesterday.

## 2024-06-05 NOTE — Discharge Instructions (Addendum)
 You were seen in the emerged from today for evaluation of your symptoms.  Glad that you are feeling better.  I have sent you home with some nausea medication.  I have also referred you to a GI doctor as a seen been seen multiple times for these symptoms.  Please make sure you call to schedule an appointment.  I recommend doing a bland diet make sure you are staying well-hydrated.  I would try to limit your marijuana and alcohol use as may be worsening some your symptoms.  I have included additional information for you to review into the paperwork.  If you have any concerns, new or worsening symptoms, please return to your nearest emergency department for reevaluation.  Your potassium is mildly low today.  Please make sure that you get this checked in the next week with your PCP.  Contact a doctor if: Your symptoms get worse. You have new symptoms. You have a fever. You cannot drink fluids without vomiting. You feel like you may vomit for more than 2 days. You feel light-headed or dizzy. You have a headache. You have muscle cramps. You have a rash. You have pain while peeing. Get help right away if: You have pain in your chest, neck, arm, or jaw. You feel very weak or you faint. You vomit again and again. You have vomit that is bright red or looks like black coffee grounds. You have bloody or black poop (stools) or poop that looks like tar. You have a very bad headache, a stiff neck, or both. You have very bad pain, cramping, or bloating in your belly (abdomen). You have trouble breathing. You are breathing very quickly. Your heart is beating very quickly. Your skin feels cold and clammy. You feel confused. You have signs of losing too much water in your body, such as: Dark pee, very little pee, or no pee. Cracked lips. Dry mouth. Sunken eyes. Sleepiness. Weakness. These symptoms may be an emergency. Get help right away. Call 911. Do not wait to see if the symptoms will go  away. Do not drive yourself to the hospital.

## 2024-06-05 NOTE — ED Notes (Signed)
 Patient given a drink for PO challenge.

## 2024-06-07 ENCOUNTER — Emergency Department (HOSPITAL_BASED_OUTPATIENT_CLINIC_OR_DEPARTMENT_OTHER)
Admission: EM | Admit: 2024-06-07 | Discharge: 2024-06-07 | Disposition: A | Discharge status: Home / Self Care | Attending: Emergency Medicine | Admitting: Emergency Medicine

## 2024-06-07 ENCOUNTER — Other Ambulatory Visit: Payer: Self-pay

## 2024-06-07 DIAGNOSIS — R109 Unspecified abdominal pain: Secondary | ICD-10-CM | POA: Diagnosis not present

## 2024-06-07 DIAGNOSIS — E876 Hypokalemia: Secondary | ICD-10-CM | POA: Diagnosis not present

## 2024-06-07 DIAGNOSIS — R112 Nausea with vomiting, unspecified: Secondary | ICD-10-CM | POA: Insufficient documentation

## 2024-06-07 LAB — COMPREHENSIVE METABOLIC PANEL WITH GFR
ALT: 19 U/L (ref 0–44)
AST: 22 U/L (ref 15–41)
Albumin: 5 g/dL (ref 3.5–5.0)
Alkaline Phosphatase: 58 U/L (ref 38–126)
Anion gap: 21 — ABNORMAL HIGH (ref 5–15)
BUN: <5 mg/dL — ABNORMAL LOW (ref 6–20)
CO2: 16 mmol/L — ABNORMAL LOW (ref 22–32)
Calcium: 10.7 mg/dL — ABNORMAL HIGH (ref 8.9–10.3)
Chloride: 103 mmol/L (ref 98–111)
Creatinine, Ser: 0.88 mg/dL (ref 0.61–1.24)
GFR, Estimated: >60 mL/min (ref >60)
Glucose, Bld: 116 mg/dL — ABNORMAL HIGH (ref 70–99)
Potassium: 3.4 mmol/L — ABNORMAL LOW (ref 3.5–5.1)
Sodium: 140 mmol/L (ref 135–145)
Total Bilirubin: 1 mg/dL (ref 0.0–1.2)
Total Protein: 7.4 g/dL (ref 6.5–8.1)

## 2024-06-07 LAB — CBC
HCT: 43.8 % (ref 39.0–52.0)
Hemoglobin: 15.5 g/dL (ref 13.0–17.0)
MCH: 30.9 pg (ref 26.0–34.0)
MCHC: 35.4 g/dL (ref 30.0–36.0)
MCV: 87.4 fL (ref 80.0–100.0)
Platelets: 336 K/uL (ref 150–400)
RBC: 5.01 MIL/uL (ref 4.22–5.81)
RDW: 11.6 % (ref 11.5–15.5)
WBC: 7.8 K/uL (ref 4.0–10.5)
nRBC: 0 % (ref 0.0–0.2)

## 2024-06-07 LAB — URINALYSIS, ROUTINE W REFLEX MICROSCOPIC
Bacteria, UA: NONE SEEN
Bilirubin Urine: NEGATIVE
Glucose, UA: NEGATIVE mg/dL
Hgb urine dipstick: NEGATIVE
Ketones, ur: 15 mg/dL — AB
Leukocytes,Ua: NEGATIVE
Nitrite: NEGATIVE
Protein, ur: NEGATIVE mg/dL
Specific Gravity, Urine: 1.009 (ref 1.005–1.030)
pH: 7.5 (ref 5.0–8.0)

## 2024-06-07 LAB — LIPASE, BLOOD: Lipase: 48 U/L (ref 11–51)

## 2024-06-07 MED ORDER — ONDANSETRON HCL 4 MG/2ML IJ SOLN
4.0000 mg | Freq: Once | INTRAMUSCULAR | Status: AC | PRN
  Administered 2024-06-07: 4 mg via INTRAVENOUS
  Filled 2024-06-07: qty 2

## 2024-06-07 MED ORDER — SODIUM CHLORIDE 0.9 % IV BOLUS
1000.0000 mL | Freq: Once | INTRAVENOUS | Status: AC
  Administered 2024-06-07: 1000 mL via INTRAVENOUS

## 2024-06-07 NOTE — ED Notes (Signed)
 Pt alert and oriented X 4 at the time of discharge. RR even and unlabored. No acute distress noted. Pt verbalized understanding of discharge instructions as discussed. Pt ambulatory to lobby at time of discharge.

## 2024-06-07 NOTE — ED Provider Notes (Signed)
 [place] EMERGENCY DEPARTMENT AT [facility] Provider Note   CSN: [id number] Arrival date & time: 06/07/24  9182     Patient presents with: Emesis   Jeffery Anderson is a 23 y.o. male who presents to the ED today for continued nausea after presenting for the same on 17 August.  Continues to have nausea and upper abdominal cramping, further endorses poor p.o. intake specifically inability to tolerate solid intake without production of nausea.  States that he had some chicken noodle soup yesterday and tolerated that well however went out to a local quick service restaurant and had nausea and vomiting within an hour of eating.  Otherwise trying to keep down liquids but states that with persistent waves of nausea that has been difficult to keep down fluids.  Has had any fever, body aches, chills, has no body aches.    Review of his previous medical history does show previous alcohol withdrawal, he does endorse drinking 3-4 shots of liquor approximately 5 nights a week, and does endorse regular cannabis use with last use being yesterday evening.  He has not had any alcohol in the last 2 to 3 days secondary to his persistent nausea.  Extensive workup on 17 August did show a hypokalemia for which the patient was given oral supplementation.  Also had signs of hemoconcentration secondary to fluid volume deficit.  This is managed at the time with ondansetron , Pepcid , and IV fluids.  He had improved condition at the time, and was discharged with prescription for omeprazole  and ondansetron .  He has been using the ondansetron  however states that he still has persistent nausea despite this.  At assessment, he does not complain of any active nausea at this time and declines any nausea medications at this time.  Has had initial dose of ondansetron  at triage.    Emesis      Prior to Admission medications   Medication Sig Start Date End Date Taking? Authorizing Provider  albuterol  (VENTOLIN  HFA)  108 (90 Base) MCG/ACT inhaler Inhale 1-2 puffs into the lungs every 6 (six) hours as needed for wheezing or shortness of breath. 10/31/22   Ruthell Lonni FALCON, PA-C  diphenhydrAMINE (BENADRYL) 25 MG tablet Take 25 mg by mouth every 6 (six) hours as needed (post nasal drip).    [provider]  omeprazole  (PRILOSEC) 20 MG capsule Take 1 capsule (20 mg total) by mouth daily. 06/05/24   Bernis Ernst, PA-C  ondansetron  (ZOFRAN -ODT) 4 MG disintegrating tablet Take 1 tablet (4 mg total) by mouth every 8 (eight) hours as needed for nausea or vomiting. 06/05/24   Bernis Ernst, PA-C    Allergies: Patient has no known allergies.    Review of Systems  Constitutional:  Positive for appetite change and fatigue.  Gastrointestinal:  Positive for nausea and vomiting.  All other systems reviewed and are negative.   Updated Vital Signs BP 130/76   Pulse 62   Temp 98 F (36.7 C) (Oral)   Resp 18   SpO2 100%   Physical Exam Vitals and nursing note reviewed.  Constitutional:      General: He is not in acute distress.    Appearance: Normal appearance.  HENT:     Head: Normocephalic and atraumatic.     Mouth/Throat:     Mouth: Mucous membranes are moist.     Pharynx: Oropharynx is clear.  Eyes:     Extraocular Movements: Extraocular movements intact.     Conjunctiva/sclera: Conjunctivae normal.     Pupils:  Pupils are equal, round, and reactive to light.  Cardiovascular:     Rate and Rhythm: Normal rate and regular rhythm.     Pulses: Normal pulses.     Heart sounds: Normal heart sounds. No murmur heard.    No friction rub. No gallop.  Pulmonary:     Effort: Pulmonary effort is normal.     Breath sounds: Normal breath sounds.  Abdominal:     General: Abdomen is flat. Bowel sounds are normal.     Palpations: Abdomen is soft.     Tenderness: There is no abdominal tenderness.  Musculoskeletal:        General: Normal range of motion.     Cervical back: Normal range of motion and neck  supple.     Right lower leg: No edema.     Left lower leg: No edema.  Skin:    General: Skin is warm and dry.     Capillary Refill: Capillary refill takes less than 2 seconds.     Comments: Normal skin turgor appreciated  Neurological:     General: No focal deficit present.     Mental Status: He is alert. Mental status is at baseline.  Psychiatric:        Mood and Affect: Mood normal.     (all labs ordered are listed, but only abnormal results are displayed) Labs Reviewed  COMPREHENSIVE METABOLIC PANEL WITH GFR - Abnormal; Notable for the following components:      Result Value   Potassium 3.4 (*)    CO2 16 (*)    Glucose, Bld 116 (*)    BUN <5 (*)    Calcium 10.7 (*)    Anion gap 21 (*)    All other components within normal limits  URINALYSIS, ROUTINE W REFLEX MICROSCOPIC - Abnormal; Notable for the following components:   Color, Urine COLORLESS (*)    Ketones, ur 15 (*)    All other components within normal limits  LIPASE, BLOOD  CBC    EKG: None  Radiology: No results found.   Procedures   Medications Ordered in the ED  ondansetron  (ZOFRAN ) injection 4 mg (4 mg Intravenous Given 06/07/24 0905)  sodium chloride  0.9 % bolus 1,000 mL (0 mLs Intravenous Stopped 06/07/24 1137)                                    Medical Decision Making Amount and/or Complexity of Data Reviewed Labs: ordered.  Risk Prescription drug management.   Medical Decision Making:   Jeffery Anderson is a 23 y.o. male who presented to the ED today with continued nausea and vomiting with abdominal cramping detailed above.    External chart has been reviewed including previous labs and imaging. Patient placed on continuous vitals and telemetry monitoring while in ED which was reviewed periodically.  Complete initial physical exam performed, notably the patient  was alert and oriented in no apparent distress.  Notably his abdomen is nontender to palpation, normal and present bowel sounds  were appreciated in all 4 quadrants, skin turgor is normal.    Reviewed and confirmed nursing documentation for past medical history, family history, social history.    Initial Assessment:   With the patient's presentation of persistent nausea and vomiting, consider gastroenteritis, bowel obstruction, pancreatitis,hepatobiliary disease, cannabinoid hyperemesis syndrome.  As he has a nontender abdomen, suspicion for pancreatic, hepatobiliary disease, or gastric ulceration is reduced.  Initial Plan:  Provide 1 L bolus of IV hydration to help manage nausea as well as likely fluid volume deficit secondary to poor p.o. intake Screening labs including CBC and Metabolic panel to evaluate for infectious or metabolic etiology of disease.  Assess serum lipase to address potential pancreatic etiology Urinalysis with reflex culture ordered to evaluate for UTI or relevant urologic/nephrologic pathology.  Consider droperidol for continued management of his nausea. Objective evaluation as below reviewed   Initial Study Results:   Laboratory  All laboratory results reviewed without evidence of clinically relevant pathology.   Exceptions include: Potassium is 3.4 and anion gap is 21 with CO2 reduced to 16  Reassessment and Plan:   After administration of 1 L fluid bolus, patient stated that his condition dramatically improved.  At that time he requested discharge.  The remainder of his lab evaluation did not show any acute abnormality.  As such, patient was discharged with plan to follow-up with his primary care, continue to use previously prescribed ondansetron  as needed for nausea.  He understands and agrees, has no further concerns at this time.       Final diagnoses:  Nausea and vomiting, unspecified vomiting type    ED Discharge Orders     None          Myriam Dorn BROCKS, [place] 06/07/24 1610    Tegeler, Lonni PARAS, MD 06/10/24 [phone]

## 2024-06-07 NOTE — ED Triage Notes (Signed)
 C/o nausea and upper abd cramping x 3 days. Here for same on 8/17. States recent THC use.

## 2024-06-07 NOTE — Discharge Instructions (Signed)
 Also discussed, please decrease your alcohol intake as well as trying to refrain from cannabis use.  Other is advance your diet as you can tolerate, clear liquids today and progressively returning to your normal diet over the next several days as you can take it.  Otherwise continue with the previously prescribed medications, and follow-up with a primary care physician to continue management of your general health.

## 2024-10-03 ENCOUNTER — Emergency Department (HOSPITAL_BASED_OUTPATIENT_CLINIC_OR_DEPARTMENT_OTHER)
Admission: EM | Admit: 2024-10-03 | Discharge: 2024-10-03 | Disposition: A | Discharge status: Home / Self Care | Source: Home / Self Care | Attending: Emergency Medicine | Admitting: Emergency Medicine

## 2024-10-03 ENCOUNTER — Other Ambulatory Visit: Payer: Self-pay

## 2024-10-03 ENCOUNTER — Emergency Department (HOSPITAL_BASED_OUTPATIENT_CLINIC_OR_DEPARTMENT_OTHER): Admitting: Radiology

## 2024-10-03 DIAGNOSIS — Z7951 Long term (current) use of inhaled steroids: Secondary | ICD-10-CM | POA: Diagnosis not present

## 2024-10-03 DIAGNOSIS — S42021A Displaced fracture of shaft of right clavicle, initial encounter for closed fracture: Secondary | ICD-10-CM

## 2024-10-03 DIAGNOSIS — J45909 Unspecified asthma, uncomplicated: Secondary | ICD-10-CM | POA: Diagnosis not present

## 2024-10-03 DIAGNOSIS — M25511 Pain in right shoulder: Secondary | ICD-10-CM | POA: Diagnosis present

## 2024-10-03 DIAGNOSIS — Y9351 Activity, roller skating (inline) and skateboarding: Secondary | ICD-10-CM | POA: Diagnosis not present

## 2024-10-03 MED ORDER — HYDROCODONE-ACETAMINOPHEN 5-325 MG PO TABS: 1.0000 | ORAL_TABLET | Freq: Four times a day (QID) | ORAL | 0 refills | Status: AC | PRN

## 2024-10-03 MED ORDER — OXYCODONE-ACETAMINOPHEN 5-325 MG PO TABS
1.0000 | ORAL_TABLET | Freq: Once | ORAL | Status: AC
  Administered 2024-10-03: 23:00:00 1 via ORAL
  Filled 2024-10-03: qty 1

## 2024-10-03 MED ORDER — IBUPROFEN 400 MG PO TABS: 400.0000 mg | ORAL_TABLET | Freq: Three times a day (TID) | ORAL | 0 refills | Status: AC

## 2024-10-03 NOTE — ED Triage Notes (Addendum)
 Pt was skating approx 1-2 hours ago and fell landing on his right clavicle. Pt has bruising and swelling of his right clavicle. Pain with movement, as well.

## 2024-10-04 NOTE — ED Provider Notes (Signed)
 [place] EMERGENCY DEPARTMENT AT [facility] Provider Note   CSN: [id number] Arrival date & time: 10/03/24  2125     Patient presents with: Clavicle Injury   Jeffery Anderson is a 23 y.o. male.   The history is provided by the patient.   Patient with history of asthma presents after concern that he injured his clavicle in a skateboarding accident This occurred around 7 PM on December 15.  He reports he was skateboarding when he fell on his right side.  He reports striking his head but no LOC.  He denies any headache, no neck pain or back pain.  No chest pain or shortness of breath.  No abdominal pain.  Most of the pain is in his right clavicle only.  No weakness or numbness is reported in his right hand.  He was able to drive afterwards and dropped friends off and then came to the hospital    Prior to Admission medications  Medication Sig Start Date End Date Taking? Authorizing Provider  HYDROcodone -acetaminophen  (NORCO/VICODIN) 5-325 MG tablet Take 1 tablet by mouth every 6 (six) hours as needed for severe pain (pain score 7-10). 10/03/24  Yes Midge Golas, MD  ibuprofen  (ADVIL ) 400 MG tablet Take 1 tablet (400 mg total) by mouth 3 (three) times daily. 10/03/24  Yes Midge Golas, MD  albuterol  (VENTOLIN  HFA) 108 (90 Base) MCG/ACT inhaler Inhale 1-2 puffs into the lungs every 6 (six) hours as needed for wheezing or shortness of breath. 10/31/22   Ruthell Lonni FALCON, PA-C    Allergies: Patient has no known allergies.    Review of Systems  Respiratory:  Negative for shortness of breath.   Cardiovascular:  Negative for chest pain.  Gastrointestinal:  Negative for abdominal pain.  Musculoskeletal:  Positive for arthralgias. Negative for back pain and neck pain.  Neurological:  Negative for headaches.    Updated Vital Signs BP 118/74   Pulse 80   Temp 97.9 F (36.6 C)   Resp 16   SpO2 99%   Physical Exam CONSTITUTIONAL: Well developed/well  nourished HEAD: Normocephalic/atraumatic no signs of head trauma EYES: EOMI/PERRL ENMT: Mucous membranes moist, no facial trauma NECK: supple no meningeal signs SPINE/BACK:entire spine nontender No bruising/crepitance/stepoffs noted to spine CV: S1/S2 noted, no murmurs/rubs/gallops noted LUNGS: Lungs are clear to auscultation bilaterally, no apparent distress Chest-no tenderness ABDOMEN: soft, nontender NEURO: Pt is awake/alert/appropriate, moves all extremitiesx4.  No facial droop.   EXTREMITIES: pulses normal/equal, full ROM Distal pulses equal/intact in both upper extremities Tenderness and mild swelling to the right clavicle, but no significant tenting No tenderness of the right shoulder/elbow or wrist Equal handgrips are noted.  He is able to make a fist and range his right wrist and elbow without difficulty SKIN: warm, color normal  (all labs ordered are listed, but only abnormal results are displayed) Labs Reviewed - No data to display  EKG: None  Radiology: DG Clavicle Right Result Date: 10/03/2024 EXAM: 2 VIEW(S) XRAY OF THE RIGHT CLAVICLE 10/03/2024 09:51:00 PM COMPARISON: None available. CLINICAL HISTORY: right clavicle injury FINDINGS: BONES: Comminuted displaced mid right clavicular shaft fracture. Inferior displacement of the distal fracture fragment. JOINTS: No joint dislocation. SOFT TISSUES: Soft tissue edema surrounding the clavicular fracture. IMPRESSION: 1. Comminuted displaced mid right clavicular shaft fracture with inferior displacement of the distal fragment. Electronically signed by: Oneil Devonshire MD 10/03/2024 10:10 PM EST RP Workstation: [id number]     .Ortho Injury Treatment  Date/Time: 10/03/2024 10:00 PM  Performed by: Midge Golas,  MD Authorized by: Midge Golas, MD   Consent:    Consent obtained:  Verbal   Consent given by:  PatientInjury location: sternoclavicular Location details: right clavicle Injury type: fracture Pre-procedure  neurovascular assessment: neurovascularly intact Pre-procedure distal perfusion: normal Pre-procedure neurological function: normal Pre-procedure range of motion: reduced  Anesthesia: Local anesthesia used: no  Patient sedated: NoManipulation performed: no Immobilization: sling Splint Applied by: ED Nurse Post-procedure neurovascular assessment: post-procedure neurovascularly intact Post-procedure distal perfusion: normal Post-procedure neurological function: normal Post-procedure range of motion: unchanged      Medications Ordered in the ED  oxyCODONE -acetaminophen  (PERCOCET/ROXICET) 5-325 MG per tablet 1 tablet (1 tablet Oral Given 10/03/24 2301)              Glasgow Coma Scale Score: 15      NEXUS Criteria Score: 0                Medical Decision Making Risk Prescription drug management.   Patient presents after he crashed his skateboard injuring his right clavicle.  He appears to have an isolated right clavicle injury.  He feels much improved after sling was applied He is neurovascular intact. No other signs of acute traumatic injuries Will provide short course of Vicodin, refer to orthopedics.  Discussed appropriate use of narcotics, as well as how to use a sling     Final diagnoses:  Closed displaced fracture of shaft of right clavicle, initial encounter    ED Discharge Orders          Ordered    ibuprofen  (ADVIL ) 400 MG tablet  3 times daily        10/03/24 2327    HYDROcodone -acetaminophen  (NORCO/VICODIN) 5-325 MG tablet  Every 6 hours PRN        10/03/24 2327               Midge Golas, MD 10/04/24 0040
